# Patient Record
Sex: Female | Born: 1955 | Race: White | Hispanic: No | Smoking: Never smoker
Health system: Southern US, Community
[De-identification: ages and names within clinical notes are randomized; demographics above are authoritative.]

## PROBLEM LIST (undated history)

## (undated) DIAGNOSIS — I7300 Raynaud's syndrome without gangrene: Secondary | ICD-10-CM

## (undated) DIAGNOSIS — K259 Gastric ulcer, unspecified as acute or chronic, without hemorrhage or perforation: Secondary | ICD-10-CM

## (undated) DIAGNOSIS — E079 Disorder of thyroid, unspecified: Secondary | ICD-10-CM

## (undated) DIAGNOSIS — A159 Respiratory tuberculosis unspecified: Secondary | ICD-10-CM

## (undated) DIAGNOSIS — E785 Hyperlipidemia, unspecified: Secondary | ICD-10-CM

## (undated) DIAGNOSIS — C801 Malignant (primary) neoplasm, unspecified: Secondary | ICD-10-CM

## (undated) DIAGNOSIS — M1990 Unspecified osteoarthritis, unspecified site: Secondary | ICD-10-CM

## (undated) DIAGNOSIS — C439 Malignant melanoma of skin, unspecified: Secondary | ICD-10-CM

## (undated) HISTORY — PX: HAMMER TOE SURGERY: SHX385

## (undated) HISTORY — DX: Hyperlipidemia, unspecified: E78.5

## (undated) HISTORY — DX: Raynaud's syndrome without gangrene: I73.00

## (undated) HISTORY — DX: Disorder of thyroid, unspecified: E07.9

## (undated) HISTORY — DX: Malignant (primary) neoplasm, unspecified: C80.1

## (undated) HISTORY — PX: COLONOSCOPY: SHX174

## (undated) HISTORY — DX: Unspecified osteoarthritis, unspecified site: M19.90

## (undated) HISTORY — PX: MELANOMA EXCISION: SHX5266

## (undated) HISTORY — DX: Gastric ulcer, unspecified as acute or chronic, without hemorrhage or perforation: K25.9

## (undated) HISTORY — PX: JOINT REPLACEMENT: SHX530

## (undated) HISTORY — DX: Malignant melanoma of skin, unspecified: C43.9

## (undated) HISTORY — DX: Respiratory tuberculosis unspecified: A15.9

---

## 2009-10-27 ENCOUNTER — Ambulatory Visit: Payer: Self-pay | Admitting: Family Medicine

## 2010-05-01 ENCOUNTER — Ambulatory Visit (HOSPITAL_COMMUNITY): Admission: RE | Admit: 2010-05-01 | Discharge: 2010-05-01 | Payer: Self-pay | Admitting: Orthopedic Surgery

## 2010-11-09 NOTE — Assessment & Plan Note (Signed)
Summary: [facility] MGT CLASS WITH DR Fleur Audino/[facility]   

## 2016-05-03 DIAGNOSIS — L439 Lichen planus, unspecified: Secondary | ICD-10-CM | POA: Diagnosis not present

## 2016-06-14 DIAGNOSIS — L439 Lichen planus, unspecified: Secondary | ICD-10-CM | POA: Diagnosis not present

## 2016-06-14 DIAGNOSIS — L814 Other melanin hyperpigmentation: Secondary | ICD-10-CM | POA: Diagnosis not present

## 2016-06-17 DIAGNOSIS — Z131 Encounter for screening for diabetes mellitus: Secondary | ICD-10-CM | POA: Diagnosis not present

## 2016-06-17 DIAGNOSIS — I10 Essential (primary) hypertension: Secondary | ICD-10-CM | POA: Diagnosis not present

## 2016-06-17 DIAGNOSIS — Z13220 Encounter for screening for lipoid disorders: Secondary | ICD-10-CM | POA: Diagnosis not present

## 2016-06-17 DIAGNOSIS — E039 Hypothyroidism, unspecified: Secondary | ICD-10-CM | POA: Diagnosis not present

## 2016-07-19 DIAGNOSIS — Z23 Encounter for immunization: Secondary | ICD-10-CM | POA: Diagnosis not present

## 2016-07-20 DIAGNOSIS — Z23 Encounter for immunization: Secondary | ICD-10-CM | POA: Diagnosis not present

## 2016-12-27 DIAGNOSIS — Z1211 Encounter for screening for malignant neoplasm of colon: Secondary | ICD-10-CM | POA: Diagnosis not present

## 2016-12-27 DIAGNOSIS — Z1389 Encounter for screening for other disorder: Secondary | ICD-10-CM | POA: Diagnosis not present

## 2016-12-27 DIAGNOSIS — Z1231 Encounter for screening mammogram for malignant neoplasm of breast: Secondary | ICD-10-CM | POA: Diagnosis not present

## 2016-12-27 DIAGNOSIS — E039 Hypothyroidism, unspecified: Secondary | ICD-10-CM | POA: Diagnosis not present

## 2016-12-27 DIAGNOSIS — Z0000 Encounter for general adult medical examination without abnormal findings: Secondary | ICD-10-CM | POA: Diagnosis not present

## 2017-01-16 DIAGNOSIS — Z1231 Encounter for screening mammogram for malignant neoplasm of breast: Secondary | ICD-10-CM | POA: Diagnosis not present

## 2017-01-16 DIAGNOSIS — M8589 Other specified disorders of bone density and structure, multiple sites: Secondary | ICD-10-CM | POA: Diagnosis not present

## 2017-01-16 DIAGNOSIS — M85852 Other specified disorders of bone density and structure, left thigh: Secondary | ICD-10-CM | POA: Diagnosis not present

## 2017-01-17 ENCOUNTER — Encounter: Payer: Self-pay | Admitting: Gastroenterology

## 2017-02-01 ENCOUNTER — Encounter: Payer: Self-pay | Admitting: Sports Medicine

## 2017-02-01 ENCOUNTER — Ambulatory Visit (INDEPENDENT_AMBULATORY_CARE_PROVIDER_SITE_OTHER): Payer: BLUE CROSS/BLUE SHIELD

## 2017-02-01 ENCOUNTER — Ambulatory Visit (INDEPENDENT_AMBULATORY_CARE_PROVIDER_SITE_OTHER): Payer: BLUE CROSS/BLUE SHIELD | Admitting: Sports Medicine

## 2017-02-01 DIAGNOSIS — M79671 Pain in right foot: Secondary | ICD-10-CM

## 2017-02-01 DIAGNOSIS — M722 Plantar fascial fibromatosis: Secondary | ICD-10-CM

## 2017-02-01 DIAGNOSIS — M79672 Pain in left foot: Secondary | ICD-10-CM

## 2017-02-01 MED ORDER — MELOXICAM 15 MG PO TABS: 15.0000 mg | ORAL_TABLET | Freq: Every day | ORAL | 0 refills | Status: DC

## 2017-02-01 MED ORDER — TRIAMCINOLONE ACETONIDE 10 MG/ML IJ SUSP: 10.0000 mg | Freq: Once | INTRAMUSCULAR | Status: DC

## 2017-02-01 MED ORDER — METHYLPREDNISOLONE 4 MG PO TBPK: ORAL_TABLET | ORAL | 0 refills | Status: DC

## 2017-02-01 NOTE — Patient Instructions (Signed)

## 2017-02-01 NOTE — Progress Notes (Signed)
   Subjective:    Patient ID: Beth Soto, female    DOB: 1956-05-27, 61 y.o.   MRN: [id number]  HPI   I have some heel pain on both feet and has been going on since thanksgiving and hurts am and the bottom of both heels hurt and I have tried taping and boots and exercises and I do have raynaud's     Review of Systems  All other systems reviewed and are negative.      Objective:   Physical Exam        Assessment & Plan:

## 2017-02-01 NOTE — Progress Notes (Signed)
Subjective: Beth Soto is a 61 y.o. female patient presents to office with complaint of heel pain on the left and right. Patient admits to post static dyskinesia since thanksgiving. Patient has treated this problem with stretching and change in shoes with no relief. Taping helped a little. Denies any other pedal complaints.   There are no active problems to display for this patient.   No current outpatient prescriptions on file prior to visit.   No current facility-administered medications on file prior to visit.     No Known Allergies  Objective: Physical Exam General: The patient is alert and oriented x3 in no acute distress.  Dermatology: Skin is warm, dry and supple bilateral lower extremities. Nails 1-10 are normal. There is no erythema, edema, no eccymosis, no open lesions present. Integument is otherwise unremarkable.  Vascular: Dorsalis Pedis pulse and Posterior Tibial pulse are 2/4 bilateral. Capillary fill time is immediate to all digits.  Neurological: Grossly intact to light touch with an achilles reflex of +2/5 and a  negative Tinel's sign bilateral.  Musculoskeletal: Tenderness to palpation at the medial calcaneal tubercale and through the insertion of the plantar fascia on the left and right foot. Sometimes pain to top of left foot. No pain with compression of calcaneus bilateral. No pain with tuning fork to calcaneus bilateral. No pain with calf compression bilateral. There is decreased Ankle joint range of motion bilateral. All other joints range of motion within normal limits bilateral. Strength 5/5 in all groups bilateral. 5th hammertoe  Gait: Unassisted, Antalgic avoid weight on left/right heel  Xray, Right and Left foot:  Normal osseous mineralization. Joint spaces preserved. 5th hammertoe No fracture/dislocation/boney destruction. Calcaneal spur present with mild thickening of plantar fascia. No other soft tissue abnormalities or radiopaque foreign bodies.    Assessment and Plan: Problem List Items Addressed This Visit    None    Visit Diagnoses    Plantar fasciitis, bilateral    -  Primary   Relevant Medications   methylPREDNISolone (MEDROL DOSEPAK) 4 MG TBPK tablet   triamcinolone acetonide (KENALOG) 10 MG/ML injection 10 mg (Start on 02/01/2017  1:15 PM)   meloxicam (MOBIC) 15 MG tablet   Other Relevant Orders   DG Foot Complete Right   DG Foot Complete Left   Foot pain, bilateral       Relevant Medications   methylPREDNISolone (MEDROL DOSEPAK) 4 MG TBPK tablet   triamcinolone acetonide (KENALOG) 10 MG/ML injection 10 mg (Start on 02/01/2017  1:15 PM)   meloxicam (MOBIC) 15 MG tablet       -Complete examination performed.  -Xrays reviewed -Discussed with patient in detail the condition of plantar fasciitis, how this occurs and general treatment options. Explained both conservative and surgical treatments.  -After oral consent and aseptic prep, injected a mixture containing 1 ml of 2%  plain lidocaine, 1 ml 0.5% plain marcaine, 0.5 ml of kenalog 10 and 0.5 ml of dexamethasone phosphate into left and right heel. Post-injection care discussed with patient.  -Rx Meloxicam to start after Medrol dose pack is completed -Recommended good supportive shoes and advised use of OTC insert. Explained to patient that if these orthoses work well, we will continue with these. If these do not improve her condition and  pain, we will consider custom molded orthoses. - Explained in detail the use of the fascial brace bilateral which was dispensed at today's visit. -Explained and dispensed to patient daily stretching exercises. -Recommend patient to ice affected area 1-2x daily. -Patient  to return to office in 4 weeks for follow up or sooner if problems or questions arise.  Landis Martins, DPM

## 2017-03-01 ENCOUNTER — Encounter: Payer: Self-pay | Admitting: Sports Medicine

## 2017-03-01 ENCOUNTER — Ambulatory Visit (INDEPENDENT_AMBULATORY_CARE_PROVIDER_SITE_OTHER): Payer: BLUE CROSS/BLUE SHIELD | Admitting: Sports Medicine

## 2017-03-01 DIAGNOSIS — M79672 Pain in left foot: Secondary | ICD-10-CM | POA: Diagnosis not present

## 2017-03-01 DIAGNOSIS — M722 Plantar fascial fibromatosis: Secondary | ICD-10-CM

## 2017-03-01 DIAGNOSIS — M79671 Pain in right foot: Secondary | ICD-10-CM

## 2017-03-01 NOTE — Progress Notes (Signed)
Subjective: Beth Soto is a 61 y.o. female returns to office for follow up evaluation after Left and Right heel injection for plantar fasciitis, injection #1 administered 4 weeks ago. Patient states that the injection seems to help her pain; pain is now 1/10 (95% better) and has  decreased in frequency to the area. Patient denies any recent changes in medications or new problems since last visit.   Admits that she walks to try to lose weight.   There are no active problems to display for this patient.   Current Outpatient Prescriptions on File Prior to Visit  Medication Sig Dispense Refill  . atorvastatin (LIPITOR) 20 MG tablet Take 20 mg by mouth at bedtime.  1  . Cholecalciferol (VITAMIN D3 PO) Take by mouth.    . finasteride (PROSCAR) 5 MG tablet Take 2.5 mg by mouth daily.  12  . folic acid (FOLVITE) 1 MG tablet Take 1 mg by mouth daily.  12  . levothyroxine (SYNTHROID, LEVOTHROID) 137 MCG tablet Take 137 mcg by mouth daily.  3  . MAGNESIUM PO Take by mouth.    . meloxicam (MOBIC) 15 MG tablet Take 1 tablet (15 mg total) by mouth daily. 30 tablet 0  . methylPREDNISolone (MEDROL DOSEPAK) 4 MG TBPK tablet Take as instructed 21 tablet 0   Current Facility-Administered Medications on File Prior to Visit  Medication Dose Route Frequency Provider Last Rate Last Dose  . triamcinolone acetonide (KENALOG) 10 MG/ML injection 10 mg  10 mg Other Once Landis Martins, DPM        No Known Allergies  Objective:   General:  Alert and oriented x 3, in no acute distress  Dermatology: Skin is warm, dry, and supple bilateral. Nails are within normal limits. There is no lower extremity erythema, no eccymosis, no open lesions present bilateral.   Vascular: Dorsalis Pedis and Posterior Tibial pedal pulses are 2/4 bilateral. + hair growth noted bilateral. Capillary Fill Time is 3 seconds in all digits. No varicosities, No edema bilateral lower extremities.   Neurological: Sensation grossly intact  to light touch with an achilles reflex of +2 and a  negative Tinel's sign bilateral. Vibratory, sharp/dull, Semmes Weinstein Monofilament within normal limits.   Musculoskeletal: There is decreased tenderness to palpation at the medial calcaneal tubercale and through the insertion of the plantar fascia on the Left and right foot. No pain with compression to calcaneus or application of tuning fork. There is decreased Ankle joint range of motion bilateral. All other joints range of motion  within normal limits bilateral. Strength 5/5 bilateral.   Assessment and Plan: Problem List Items Addressed This Visit    None    Visit Diagnoses    Plantar fasciitis, bilateral    -  Primary   Foot pain, bilateral          -Complete examination performed.  -Previous x-rays reviewed. -Re-Discussed with patient in detail the condition of plantar fasciitis, how this  occurs related to the foot type of the patient and general treatment options. -Patient to finish Mobic as Rx may call for refill if needed  -No re-injection due to markedly improved symptoms  -Dispensed night splint to alternate between left and right foot  -Continue with stretching, icing, good supportive shoes, inserts daily.  -Discussed long term care and reocurrence; will closely monitor; if fails to improve will consider other treatment modalities.  -Patient to return to office in 4-6 weeks for follow up or sooner if problems or questions arise.  Landis Martins, DPM

## 2017-03-07 ENCOUNTER — Ambulatory Visit (AMBULATORY_SURGERY_CENTER): Payer: Self-pay | Admitting: *Deleted

## 2017-03-07 VITALS — Ht 64.0 in | Wt 190.4 lb

## 2017-03-07 DIAGNOSIS — Z1211 Encounter for screening for malignant neoplasm of colon: Secondary | ICD-10-CM

## 2017-03-07 MED ORDER — NA SULFATE-K SULFATE-MG SULF 17.5-3.13-1.6 GM/177ML PO SOLN: ORAL | 0 refills | Status: DC

## 2017-03-07 NOTE — Progress Notes (Signed)
Pt denies allergies to eggs or soy products. Denies difficulty with sedation or anesthesia. Denies any diet or weight loss medications. Denies use of supplemental oxygen.  Emmi instructions given for procedure.  

## 2017-03-08 ENCOUNTER — Telehealth: Payer: Self-pay | Admitting: *Deleted

## 2017-03-08 MED ORDER — MELOXICAM 15 MG PO TABS: 15.0000 mg | ORAL_TABLET | Freq: Every day | ORAL | 0 refills | Status: DC

## 2017-03-08 NOTE — Telephone Encounter (Signed)
Pt states at last visit Dr. Cannon Kettle said if she needed she could refill the Meloxicam. I informed pt I had seen the refill order in clinicals of 03/01/2017 and would send to her pharmacy.

## 2017-03-09 ENCOUNTER — Encounter: Payer: Self-pay | Admitting: Gastroenterology

## 2017-03-13 ENCOUNTER — Telehealth: Payer: Self-pay | Admitting: Gastroenterology

## 2017-03-13 NOTE — Telephone Encounter (Signed)
Spoke with patient and she stated that she could not afford the suprep.   I will leave her a coupon at the frint desk, and leave her emmi instructions with it.

## 2017-03-20 ENCOUNTER — Ambulatory Visit (AMBULATORY_SURGERY_CENTER): Payer: BLUE CROSS/BLUE SHIELD | Admitting: Gastroenterology

## 2017-03-20 ENCOUNTER — Encounter: Payer: Self-pay | Admitting: Gastroenterology

## 2017-03-20 VITALS — BP 122/72 | HR 58 | Temp 97.3°F | Resp 15 | Ht 64.0 in | Wt 190.0 lb

## 2017-03-20 DIAGNOSIS — D123 Benign neoplasm of transverse colon: Secondary | ICD-10-CM

## 2017-03-20 DIAGNOSIS — K5730 Diverticulosis of large intestine without perforation or abscess without bleeding: Secondary | ICD-10-CM | POA: Diagnosis not present

## 2017-03-20 DIAGNOSIS — D126 Benign neoplasm of colon, unspecified: Secondary | ICD-10-CM

## 2017-03-20 DIAGNOSIS — D122 Benign neoplasm of ascending colon: Secondary | ICD-10-CM

## 2017-03-20 DIAGNOSIS — Z1212 Encounter for screening for malignant neoplasm of rectum: Secondary | ICD-10-CM

## 2017-03-20 DIAGNOSIS — K635 Polyp of colon: Secondary | ICD-10-CM | POA: Diagnosis not present

## 2017-03-20 DIAGNOSIS — Z1211 Encounter for screening for malignant neoplasm of colon: Secondary | ICD-10-CM

## 2017-03-20 MED ORDER — SODIUM CHLORIDE 0.9 % IV SOLN: 500.0000 mL | INTRAVENOUS | Status: AC

## 2017-03-20 NOTE — Patient Instructions (Signed)
YOU HAD AN ENDOSCOPIC PROCEDURE TODAY AT [facility]:   Refer to the procedure report that was given to you for any specific questions about what was found during the examination.  If the procedure report does not answer your questions, please call your gastroenterologist to clarify.  If you requested that your care partner not be given the details of your procedure findings, then the procedure report has been included in a sealed envelope for you to review at your convenience later.  YOU SHOULD EXPECT: Some feelings of bloating in the abdomen. Passage of more gas than usual.  Walking can help get rid of the air that was put into your GI tract during the procedure and reduce the bloating. If you had a lower endoscopy (such as a colonoscopy or flexible sigmoidoscopy) you may notice spotting of blood in your stool or on the toilet paper. If you underwent a bowel prep for your procedure, you may not have a normal bowel movement for a few days.  Please Note:  You might notice some irritation and congestion in your nose or some drainage.  This is from the oxygen used during your procedure.  There is no need for concern and it should clear up in a day or so.  SYMPTOMS TO REPORT IMMEDIATELY:   Following lower endoscopy (colonoscopy or flexible sigmoidoscopy):  Excessive amounts of blood in the stool  Significant tenderness or worsening of abdominal pains  Swelling of the abdomen that is new, acute  Fever of 100F or higher   For urgent or emergent issues, a gastroenterologist can be reached at any hour by calling [phone].  Please read al handouts given to you by your recovery nurse.   DIET:  We do recommend a small meal at first, but then you may proceed to your regular diet.  Drink plenty of fluids but you should avoid alcoholic beverages for 24 hours.  ACTIVITY:  You should plan to take it easy for the rest of today and you should NOT DRIVE or use heavy machinery until  tomorrow (because of the sedation medicines used during the test).    FOLLOW UP: Our staff will call the number listed on your records the next business day following your procedure to check on you and address any questions or concerns that you may have regarding the information given to you following your procedure. If we do not reach you, we will leave a message.  However, if you are feeling well and you are not experiencing any problems, there is no need to return our call.  We will assume that you have returned to your regular daily activities without incident.  If any biopsies were taken you will be contacted by phone or by letter within the next 1-3 weeks.  Please call us at [phone] if you have not heard about the biopsies in 3 weeks.    SIGNATURES/CONFIDENTIALITY: You and/or your care partner have signed paperwork which will be entered into your electronic medical record.  These signatures attest to the fact that that the information above on your After Visit Summary has been reviewed and is understood.  Full responsibility of the confidentiality of this discharge information lies with you and/or your care-partner.  Thank you for letting us take care of your healthcare needs today.

## 2017-03-20 NOTE — Progress Notes (Signed)
Report given to PACU, vss 

## 2017-03-20 NOTE — Op Note (Signed)
[facility] Patient Name: Beth Soto Procedure Date: 03/20/2017 10:32 AM MRN: [id number] Endoscopist: Milus Banister , MD Age: 61 Referring MD:  Date of Birth: 03-28-1956 Gender: Female Account #: [id number] Procedure:                Colonoscopy Indications:              Screening for colorectal malignant neoplasm Medicines:                Monitored Anesthesia Care Procedure:                Pre-Anesthesia Assessment:                           - Prior to the procedure, a History and Physical                            was performed, and patient medications and                            allergies were reviewed. The patient's tolerance of                            previous anesthesia was also reviewed. The risks                            and benefits of the procedure and the sedation                            options and risks were discussed with the patient.                            All questions were answered, and informed consent                            was obtained. Prior Anticoagulants: The patient has                            taken no previous anticoagulant or antiplatelet                            agents. ASA Grade Assessment: II - A patient with                            mild systemic disease. After reviewing the risks                            and benefits, the patient was deemed in                            satisfactory condition to undergo the procedure.                           After obtaining informed consent, the colonoscope  was passed under direct vision. Throughout the                            procedure, the patient's blood pressure, pulse, and                            oxygen saturations were monitored continuously. The                            Colonoscope was introduced through the anus and                            advanced to the the cecum, identified by                            appendiceal orifice and  ileocecal valve. The                            colonoscopy was performed without difficulty. The                            patient tolerated the procedure well. The quality                            of the bowel preparation was excellent. The                            ileocecal valve, appendiceal orifice, and rectum                            were photographed. Scope In: 10:41:12 AM Scope Out: 10:50:39 AM Scope Withdrawal Time: 0 hours 7 minutes 17 seconds  Total Procedure Duration: 0 hours 9 minutes 27 seconds  Findings:                 Two sessile polyps were found in the transverse                            colon and ascending colon. The polyps were 3 to 5                            mm in size. These polyps were removed with a cold                            snare. Resection and retrieval were complete.                           Many small and large-mouthed diverticula were found                            in the left colon.                           The exam was otherwise without abnormality on  direct and retroflexion views. Complications:            No immediate complications. Estimated blood loss:                            None. Estimated Blood Loss:     Estimated blood loss: none. Impression:               - Two 3 to 5 mm polyps in the transverse colon and                            in the ascending colon, removed with a cold snare.                            Resected and retrieved.                           - Diverticulosis in the left colon.                           - The examination was otherwise normal on direct                            and retroflexion views. Recommendation:           - Patient has a contact number available for                            emergencies. The signs and symptoms of potential                            delayed complications were discussed with the                            patient. Return to normal activities  tomorrow.                            Written discharge instructions were provided to the                            patient.                           - Resume previous diet.                           - Continue present medications.                           You will receive a letter within 2-3 weeks with the                            pathology results and my final recommendations.                           If the polyp(s) is proven to be 'pre-cancerous' on  pathology, you will need repeat colonoscopy in 5                            years. If the polyp(s) is NOT 'precancerous' on                            pathology then you should repeat colon cancer                            screening in 10 years with colonoscopy without need                            for colon cancer screening by any method prior to                            then (including stool testing). Milus Banister, MD 03/20/2017 10:53:16 AM This report has been signed electronically.

## 2017-03-20 NOTE — Progress Notes (Signed)
Called to room to assist during endoscopic procedure.  Patient ID and intended procedure confirmed with present staff. Received instructions for my participation in the procedure from the performing physician.  

## 2017-03-21 ENCOUNTER — Telehealth: Payer: Self-pay | Admitting: *Deleted

## 2017-03-21 NOTE — Telephone Encounter (Signed)
No answer, left message to call if questions or concerns. 

## 2017-03-27 ENCOUNTER — Encounter: Payer: Self-pay | Admitting: Gastroenterology

## 2017-04-04 DIAGNOSIS — E785 Hyperlipidemia, unspecified: Secondary | ICD-10-CM | POA: Diagnosis not present

## 2017-04-06 ENCOUNTER — Ambulatory Visit (INDEPENDENT_AMBULATORY_CARE_PROVIDER_SITE_OTHER): Payer: BLUE CROSS/BLUE SHIELD | Admitting: Sports Medicine

## 2017-04-06 DIAGNOSIS — M722 Plantar fascial fibromatosis: Secondary | ICD-10-CM | POA: Diagnosis not present

## 2017-04-06 DIAGNOSIS — M79672 Pain in left foot: Secondary | ICD-10-CM

## 2017-04-06 MED ORDER — TRIAMCINOLONE ACETONIDE 10 MG/ML IJ SUSP: 10.0000 mg | Freq: Once | INTRAMUSCULAR | Status: DC

## 2017-04-06 NOTE — Progress Notes (Signed)
Subjective: Beth Soto is a 61 y.o. female returns to office for follow up evaluation after Left and Right heel injection for plantar fasciitis, injection #1 administered 8 weeks ago. Patient states that the injection seems to help her pain; there is no pain on the right. However, there is a little residual pain on the left, most noticeable after she goes on her exercise walks where she feels a little increase in pain that is resolved, rest and icing. Patient denies any recent changes in medications or new problems since last visit. Reports that she stopped taking her Mobic after colonoscopy.   There are no active problems to display for this patient.   Current Outpatient Prescriptions on File Prior to Visit  Medication Sig Dispense Refill  . Cholecalciferol (VITAMIN D3 PO) Take by mouth.    . finasteride (PROSCAR) 5 MG tablet Take 2.5 mg by mouth daily.  12  . folic acid (FOLVITE) 1 MG tablet Take 1 mg by mouth daily.  12  . levothyroxine (SYNTHROID, LEVOTHROID) 137 MCG tablet Take 137 mcg by mouth daily.  3  . MAGNESIUM PO Take by mouth.    . meloxicam (MOBIC) 15 MG tablet Take 1 tablet (15 mg total) by mouth daily. 30 tablet 0  . methylPREDNISolone (MEDROL DOSEPAK) 4 MG TBPK tablet Take as instructed (Patient not taking: Reported on 03/20/2017) 21 tablet 0   Current Facility-Administered Medications on File Prior to Visit  Medication Dose Route Frequency Provider Last Rate Last Dose  . 0.9 %  sodium chloride infusion  500 mL Intravenous Continuous Milus Banister, MD      . triamcinolone acetonide (KENALOG) 10 MG/ML injection 10 mg  10 mg Other Once Landis Martins, DPM        No Known Allergies  Objective:   General:  Alert and oriented x 3, in no acute distress  Dermatology: Skin is warm, dry, and supple bilateral. Nails are within normal limits. There is no lower extremity erythema, no eccymosis, no open lesions present bilateral.   Vascular: Dorsalis Pedis and Posterior  Tibial pedal pulses are 2/4 bilateral. + hair growth noted bilateral. Capillary Fill Time is 3 seconds in all digits. No varicosities, No edema bilateral lower extremities.   Neurological: Sensation grossly intact to light touch with an achilles reflex of +2 and a  negative Tinel's sign bilateral. Vibratory, sharp/dull, Semmes Weinstein Monofilament within normal limits.   Musculoskeletal: There is decreased tenderness to palpation at the medial calcaneal tubercale and through the insertion of the plantar fascia however, there is some tenderness still on the left. No tenderness on the right. No pain with compression to calcaneus or application of tuning fork. There is decreased Ankle joint range of motion bilateral. All other joints range of motion  within normal limits bilateral. Strength 5/5 bilateral.   Assessment and Plan: Problem List Items Addressed This Visit    None    Visit Diagnoses    Plantar fasciitis of left foot    -  Primary   Relevant Medications   triamcinolone acetonide (KENALOG) 10 MG/ML injection 10 mg   Left foot pain       Relevant Medications   triamcinolone acetonide (KENALOG) 10 MG/ML injection 10 mg      -Complete examination performed.  -Previous x-rays reviewed. -Re-Discussed with patient in detail the condition of plantar fasciitis, how this  occurs related to the foot type of the patient and general treatment options. After oral consent and aseptic prep, injected a  mixture containing 1 ml of 2%  plain lidocaine, 1 ml 0.5% plain marcaine, 0.5 ml of kenalog 10 and 0.5 ml of dexamethasone phosphate into left heel at plantar fascia without complication. Post-injection care discussed with patient.  -Patient has Modic to finish as needed -Continue with night splint to alternate between left and right foot  -Continue with stretching, icing, good supportive shoes, inserts daily.  -Discussed long term care and reocurrence; will closely monitor; if fails to improve  will consider other treatment modalities.  -Patient to return to office as needed or sooner if problems or questions arise. If flares up. Patient may benefit from reinjection and custom orthotics.  Landis Martins, DPM

## 2017-07-06 DIAGNOSIS — D2262 Melanocytic nevi of left upper limb, including shoulder: Secondary | ICD-10-CM | POA: Diagnosis not present

## 2017-07-06 DIAGNOSIS — L821 Other seborrheic keratosis: Secondary | ICD-10-CM | POA: Diagnosis not present

## 2017-07-07 DIAGNOSIS — E039 Hypothyroidism, unspecified: Secondary | ICD-10-CM | POA: Diagnosis not present

## 2017-07-07 DIAGNOSIS — E785 Hyperlipidemia, unspecified: Secondary | ICD-10-CM | POA: Diagnosis not present

## 2017-09-12 DIAGNOSIS — S76011A Strain of muscle, fascia and tendon of right hip, initial encounter: Secondary | ICD-10-CM | POA: Diagnosis not present

## 2017-09-14 DIAGNOSIS — D485 Neoplasm of uncertain behavior of skin: Secondary | ICD-10-CM | POA: Diagnosis not present

## 2017-09-14 DIAGNOSIS — D045 Carcinoma in situ of skin of trunk: Secondary | ICD-10-CM | POA: Diagnosis not present

## 2017-09-28 DIAGNOSIS — D045 Carcinoma in situ of skin of trunk: Secondary | ICD-10-CM | POA: Diagnosis not present

## 2018-01-12 DIAGNOSIS — E039 Hypothyroidism, unspecified: Secondary | ICD-10-CM | POA: Diagnosis not present

## 2018-01-12 DIAGNOSIS — Z1231 Encounter for screening mammogram for malignant neoplasm of breast: Secondary | ICD-10-CM | POA: Diagnosis not present

## 2018-01-12 DIAGNOSIS — E785 Hyperlipidemia, unspecified: Secondary | ICD-10-CM | POA: Diagnosis not present

## 2018-01-12 DIAGNOSIS — J3489 Other specified disorders of nose and nasal sinuses: Secondary | ICD-10-CM | POA: Diagnosis not present

## 2018-02-08 DIAGNOSIS — Z1231 Encounter for screening mammogram for malignant neoplasm of breast: Secondary | ICD-10-CM | POA: Diagnosis not present

## 2018-02-22 DIAGNOSIS — R928 Other abnormal and inconclusive findings on diagnostic imaging of breast: Secondary | ICD-10-CM | POA: Diagnosis not present

## 2018-03-13 DIAGNOSIS — M25551 Pain in right hip: Secondary | ICD-10-CM | POA: Diagnosis not present

## 2018-03-16 DIAGNOSIS — M1611 Unilateral primary osteoarthritis, right hip: Secondary | ICD-10-CM | POA: Diagnosis not present

## 2018-03-16 DIAGNOSIS — M25551 Pain in right hip: Secondary | ICD-10-CM | POA: Diagnosis not present

## 2018-03-19 DIAGNOSIS — M25551 Pain in right hip: Secondary | ICD-10-CM | POA: Diagnosis not present

## 2018-03-21 DIAGNOSIS — M1611 Unilateral primary osteoarthritis, right hip: Secondary | ICD-10-CM | POA: Diagnosis not present

## 2018-03-21 DIAGNOSIS — M25551 Pain in right hip: Secondary | ICD-10-CM | POA: Diagnosis not present

## 2018-04-30 DIAGNOSIS — M25551 Pain in right hip: Secondary | ICD-10-CM | POA: Diagnosis not present

## 2018-06-27 DIAGNOSIS — Z01419 Encounter for gynecological examination (general) (routine) without abnormal findings: Secondary | ICD-10-CM | POA: Diagnosis not present

## 2018-06-27 DIAGNOSIS — Z124 Encounter for screening for malignant neoplasm of cervix: Secondary | ICD-10-CM | POA: Diagnosis not present

## 2018-06-27 DIAGNOSIS — Z1151 Encounter for screening for human papillomavirus (HPV): Secondary | ICD-10-CM | POA: Diagnosis not present

## 2018-06-27 DIAGNOSIS — Z23 Encounter for immunization: Secondary | ICD-10-CM | POA: Diagnosis not present

## 2018-07-09 DIAGNOSIS — L814 Other melanin hyperpigmentation: Secondary | ICD-10-CM | POA: Diagnosis not present

## 2018-07-09 DIAGNOSIS — D1801 Hemangioma of skin and subcutaneous tissue: Secondary | ICD-10-CM | POA: Diagnosis not present

## 2018-07-18 DIAGNOSIS — E785 Hyperlipidemia, unspecified: Secondary | ICD-10-CM | POA: Diagnosis not present

## 2018-07-18 DIAGNOSIS — Z6832 Body mass index (BMI) 32.0-32.9, adult: Secondary | ICD-10-CM | POA: Diagnosis not present

## 2018-07-18 DIAGNOSIS — E039 Hypothyroidism, unspecified: Secondary | ICD-10-CM | POA: Diagnosis not present

## 2018-07-18 DIAGNOSIS — Z1331 Encounter for screening for depression: Secondary | ICD-10-CM | POA: Diagnosis not present

## 2018-07-18 DIAGNOSIS — E669 Obesity, unspecified: Secondary | ICD-10-CM | POA: Diagnosis not present

## 2018-08-16 DIAGNOSIS — E039 Hypothyroidism, unspecified: Secondary | ICD-10-CM | POA: Diagnosis not present

## 2018-12-24 DIAGNOSIS — M1611 Unilateral primary osteoarthritis, right hip: Secondary | ICD-10-CM | POA: Diagnosis not present

## 2018-12-24 DIAGNOSIS — M25551 Pain in right hip: Secondary | ICD-10-CM | POA: Diagnosis not present

## 2018-12-26 DIAGNOSIS — R69 Illness, unspecified: Secondary | ICD-10-CM | POA: Diagnosis not present

## 2019-01-02 DIAGNOSIS — B009 Herpesviral infection, unspecified: Secondary | ICD-10-CM | POA: Diagnosis not present

## 2019-01-02 DIAGNOSIS — L669 Cicatricial alopecia, unspecified: Secondary | ICD-10-CM | POA: Diagnosis not present

## 2019-01-02 DIAGNOSIS — M1611 Unilateral primary osteoarthritis, right hip: Secondary | ICD-10-CM | POA: Diagnosis not present

## 2019-01-02 DIAGNOSIS — E039 Hypothyroidism, unspecified: Secondary | ICD-10-CM | POA: Diagnosis not present

## 2019-01-02 DIAGNOSIS — G8929 Other chronic pain: Secondary | ICD-10-CM | POA: Diagnosis not present

## 2019-01-02 DIAGNOSIS — E785 Hyperlipidemia, unspecified: Secondary | ICD-10-CM | POA: Diagnosis not present

## 2019-01-02 DIAGNOSIS — R030 Elevated blood-pressure reading, without diagnosis of hypertension: Secondary | ICD-10-CM | POA: Diagnosis not present

## 2019-01-02 DIAGNOSIS — N393 Stress incontinence (female) (male): Secondary | ICD-10-CM | POA: Diagnosis not present

## 2019-01-02 DIAGNOSIS — M8580 Other specified disorders of bone density and structure, unspecified site: Secondary | ICD-10-CM | POA: Diagnosis not present

## 2019-01-02 DIAGNOSIS — E669 Obesity, unspecified: Secondary | ICD-10-CM | POA: Diagnosis not present

## 2019-01-23 DIAGNOSIS — M8589 Other specified disorders of bone density and structure, multiple sites: Secondary | ICD-10-CM | POA: Diagnosis not present

## 2019-01-23 DIAGNOSIS — E039 Hypothyroidism, unspecified: Secondary | ICD-10-CM | POA: Diagnosis not present

## 2019-01-23 DIAGNOSIS — E785 Hyperlipidemia, unspecified: Secondary | ICD-10-CM | POA: Diagnosis not present

## 2019-01-23 DIAGNOSIS — Z1231 Encounter for screening mammogram for malignant neoplasm of breast: Secondary | ICD-10-CM | POA: Diagnosis not present

## 2019-03-20 DIAGNOSIS — M1611 Unilateral primary osteoarthritis, right hip: Secondary | ICD-10-CM | POA: Diagnosis not present

## 2019-03-20 DIAGNOSIS — M25551 Pain in right hip: Secondary | ICD-10-CM | POA: Diagnosis not present

## 2019-04-22 DIAGNOSIS — Z1231 Encounter for screening mammogram for malignant neoplasm of breast: Secondary | ICD-10-CM | POA: Diagnosis not present

## 2019-04-22 DIAGNOSIS — M8589 Other specified disorders of bone density and structure, multiple sites: Secondary | ICD-10-CM | POA: Diagnosis not present

## 2019-04-22 DIAGNOSIS — M85852 Other specified disorders of bone density and structure, left thigh: Secondary | ICD-10-CM | POA: Diagnosis not present

## 2019-07-08 DIAGNOSIS — R69 Illness, unspecified: Secondary | ICD-10-CM | POA: Diagnosis not present

## 2019-07-15 DIAGNOSIS — R69 Illness, unspecified: Secondary | ICD-10-CM | POA: Diagnosis not present

## 2019-07-17 DIAGNOSIS — D2239 Melanocytic nevi of other parts of face: Secondary | ICD-10-CM | POA: Diagnosis not present

## 2019-07-17 DIAGNOSIS — L820 Inflamed seborrheic keratosis: Secondary | ICD-10-CM | POA: Diagnosis not present

## 2019-07-17 DIAGNOSIS — D225 Melanocytic nevi of trunk: Secondary | ICD-10-CM | POA: Diagnosis not present

## 2019-07-17 DIAGNOSIS — D1801 Hemangioma of skin and subcutaneous tissue: Secondary | ICD-10-CM | POA: Diagnosis not present

## 2019-07-17 DIAGNOSIS — L821 Other seborrheic keratosis: Secondary | ICD-10-CM | POA: Diagnosis not present

## 2019-07-31 DIAGNOSIS — E039 Hypothyroidism, unspecified: Secondary | ICD-10-CM | POA: Diagnosis not present

## 2019-07-31 DIAGNOSIS — Z1331 Encounter for screening for depression: Secondary | ICD-10-CM | POA: Diagnosis not present

## 2019-07-31 DIAGNOSIS — Z9181 History of falling: Secondary | ICD-10-CM | POA: Diagnosis not present

## 2019-07-31 DIAGNOSIS — E785 Hyperlipidemia, unspecified: Secondary | ICD-10-CM | POA: Diagnosis not present

## 2019-09-04 DIAGNOSIS — R69 Illness, unspecified: Secondary | ICD-10-CM | POA: Diagnosis not present

## 2019-10-29 DIAGNOSIS — Z01419 Encounter for gynecological examination (general) (routine) without abnormal findings: Secondary | ICD-10-CM | POA: Diagnosis not present

## 2019-12-20 DIAGNOSIS — H25813 Combined forms of age-related cataract, bilateral: Secondary | ICD-10-CM | POA: Diagnosis not present

## 2020-01-29 DIAGNOSIS — E039 Hypothyroidism, unspecified: Secondary | ICD-10-CM | POA: Diagnosis not present

## 2020-01-29 DIAGNOSIS — E785 Hyperlipidemia, unspecified: Secondary | ICD-10-CM | POA: Diagnosis not present

## 2020-01-29 DIAGNOSIS — Z139 Encounter for screening, unspecified: Secondary | ICD-10-CM | POA: Diagnosis not present

## 2020-04-02 DIAGNOSIS — M25551 Pain in right hip: Secondary | ICD-10-CM | POA: Insufficient documentation

## 2020-04-02 HISTORY — DX: Pain in right hip: M25.551

## 2020-04-06 DIAGNOSIS — M25551 Pain in right hip: Secondary | ICD-10-CM | POA: Diagnosis not present

## 2020-04-08 DIAGNOSIS — M1611 Unilateral primary osteoarthritis, right hip: Secondary | ICD-10-CM | POA: Diagnosis not present

## 2020-04-09 HISTORY — PX: TOTAL HIP ARTHROPLASTY: SHX124

## 2020-04-10 DIAGNOSIS — M1611 Unilateral primary osteoarthritis, right hip: Secondary | ICD-10-CM | POA: Diagnosis not present

## 2020-04-10 DIAGNOSIS — Z0000 Encounter for general adult medical examination without abnormal findings: Secondary | ICD-10-CM | POA: Diagnosis not present

## 2020-04-10 DIAGNOSIS — E039 Hypothyroidism, unspecified: Secondary | ICD-10-CM | POA: Diagnosis not present

## 2020-04-10 DIAGNOSIS — Z01818 Encounter for other preprocedural examination: Secondary | ICD-10-CM | POA: Diagnosis not present

## 2020-04-27 DIAGNOSIS — Z1231 Encounter for screening mammogram for malignant neoplasm of breast: Secondary | ICD-10-CM | POA: Diagnosis not present

## 2020-05-05 DIAGNOSIS — M1611 Unilateral primary osteoarthritis, right hip: Secondary | ICD-10-CM | POA: Diagnosis not present

## 2020-05-11 DIAGNOSIS — E669 Obesity, unspecified: Secondary | ICD-10-CM | POA: Diagnosis not present

## 2020-05-11 DIAGNOSIS — E039 Hypothyroidism, unspecified: Secondary | ICD-10-CM | POA: Diagnosis not present

## 2020-05-11 DIAGNOSIS — M1990 Unspecified osteoarthritis, unspecified site: Secondary | ICD-10-CM | POA: Diagnosis not present

## 2020-05-11 DIAGNOSIS — Z6834 Body mass index (BMI) 34.0-34.9, adult: Secondary | ICD-10-CM | POA: Diagnosis not present

## 2020-05-11 DIAGNOSIS — G8929 Other chronic pain: Secondary | ICD-10-CM | POA: Diagnosis not present

## 2020-05-11 DIAGNOSIS — Z791 Long term (current) use of non-steroidal anti-inflammatories (NSAID): Secondary | ICD-10-CM | POA: Diagnosis not present

## 2020-05-11 DIAGNOSIS — R030 Elevated blood-pressure reading, without diagnosis of hypertension: Secondary | ICD-10-CM | POA: Diagnosis not present

## 2020-05-11 DIAGNOSIS — R69 Illness, unspecified: Secondary | ICD-10-CM | POA: Diagnosis not present

## 2020-05-11 DIAGNOSIS — E785 Hyperlipidemia, unspecified: Secondary | ICD-10-CM | POA: Diagnosis not present

## 2020-05-11 DIAGNOSIS — B009 Herpesviral infection, unspecified: Secondary | ICD-10-CM | POA: Diagnosis not present

## 2020-06-09 DIAGNOSIS — Z471 Aftercare following joint replacement surgery: Secondary | ICD-10-CM | POA: Diagnosis not present

## 2020-06-09 DIAGNOSIS — Z96641 Presence of right artificial hip joint: Secondary | ICD-10-CM | POA: Diagnosis not present

## 2020-10-01 DIAGNOSIS — L02413 Cutaneous abscess of right upper limb: Secondary | ICD-10-CM

## 2020-10-01 HISTORY — DX: Cutaneous abscess of right upper limb: L02.413

## 2020-10-15 DIAGNOSIS — Z09 Encounter for follow-up examination after completed treatment for conditions other than malignant neoplasm: Secondary | ICD-10-CM | POA: Insufficient documentation

## 2020-10-15 HISTORY — DX: Encounter for follow-up examination after completed treatment for conditions other than malignant neoplasm: Z09

## 2020-10-16 DIAGNOSIS — K580 Irritable bowel syndrome with diarrhea: Secondary | ICD-10-CM | POA: Diagnosis not present

## 2020-10-16 DIAGNOSIS — K529 Noninfective gastroenteritis and colitis, unspecified: Secondary | ICD-10-CM | POA: Diagnosis not present

## 2020-10-16 DIAGNOSIS — K625 Hemorrhage of anus and rectum: Secondary | ICD-10-CM | POA: Diagnosis not present

## 2020-10-16 DIAGNOSIS — R1084 Generalized abdominal pain: Secondary | ICD-10-CM | POA: Diagnosis not present

## 2020-10-19 DIAGNOSIS — K529 Noninfective gastroenteritis and colitis, unspecified: Secondary | ICD-10-CM | POA: Diagnosis not present

## 2020-10-23 DIAGNOSIS — R1084 Generalized abdominal pain: Secondary | ICD-10-CM | POA: Diagnosis not present

## 2020-10-23 DIAGNOSIS — R933 Abnormal findings on diagnostic imaging of other parts of digestive tract: Secondary | ICD-10-CM | POA: Diagnosis not present

## 2020-10-23 DIAGNOSIS — R109 Unspecified abdominal pain: Secondary | ICD-10-CM | POA: Diagnosis not present

## 2020-10-23 DIAGNOSIS — I700 Atherosclerosis of aorta: Secondary | ICD-10-CM | POA: Diagnosis not present

## 2020-10-30 ENCOUNTER — Telehealth: Payer: Self-pay | Admitting: Gastroenterology

## 2020-10-30 NOTE — Telephone Encounter (Signed)
The pt has been scheduled for 3/1 appt with Dr Ardis Hughs to discuss recent CT ordered by PCP.  She has been advised

## 2020-10-30 NOTE — Telephone Encounter (Signed)
° °  I was sent a copy of a CT scan report.  The scan was performed on October 23, 2020.  Indication "mid abdominal pain for 1 week, chronic diarrhea".  Ordering physician was Dr. Therisa Doyne.  The report reads "there appears to be partially circumferential soft tissue thickening of the cecum just superior to the ileocecal valve.  Malignancy is difficult to exclude.  Consider colonoscopy to further evaluate"    Can you please reach out to her.  I believe her primary care physician forwarded this CT scan report to me.  She needs my first available office appointment to discuss and consider further testing.

## 2020-11-02 DIAGNOSIS — Z01419 Encounter for gynecological examination (general) (routine) without abnormal findings: Secondary | ICD-10-CM | POA: Diagnosis not present

## 2020-11-19 DIAGNOSIS — M7542 Impingement syndrome of left shoulder: Secondary | ICD-10-CM | POA: Diagnosis not present

## 2020-12-08 ENCOUNTER — Other Ambulatory Visit: Payer: Self-pay

## 2020-12-08 ENCOUNTER — Ambulatory Visit: Payer: Medicare HMO | Admitting: Gastroenterology

## 2020-12-08 ENCOUNTER — Encounter: Payer: Self-pay | Admitting: Gastroenterology

## 2020-12-08 VITALS — BP 138/70 | HR 76 | Ht 62.5 in | Wt 200.2 lb

## 2020-12-08 DIAGNOSIS — K529 Noninfective gastroenteritis and colitis, unspecified: Secondary | ICD-10-CM

## 2020-12-08 DIAGNOSIS — R933 Abnormal findings on diagnostic imaging of other parts of digestive tract: Secondary | ICD-10-CM | POA: Diagnosis not present

## 2020-12-08 MED ORDER — PLENVU 140 G PO SOLR: 1.0000 | ORAL | 0 refills | Status: DC

## 2020-12-08 NOTE — Progress Notes (Signed)
HPI: This is a very pleasant 65 year old woman whom I last saw the time of a colonoscopy about three and half years ago.   I did a screening colonoscopy for her June 2018.  I removed 2 subcentimeter polyps and documented multiple diverticulum in her left colon.  One of the polyps was adenomatous and the other was hyperplastic.  She needs repeat recall colonoscopy at 7-year interval.  Last month I was sent a copy of the CT scan report, I believe from her primary care physician.  There was no information to put this into context and so I asked that she come in here for an office visit.  The scan was performed on October 23, 2020.  Indication "mid abdominal pain for 1 week, chronic diarrhea".  Ordering physician was Dr. Therisa Doyne.  The report reads "there appears to be partially circumferential soft tissue thickening of the cecum just superior to the ileocecal valve.  Malignancy is difficult to exclude.  Consider colonoscopy to further evaluate"  She tells me today that the CAT scan was part of a work-up for chronic diarrhea.  She was having intermittent loose stools until about 2017.  She was thought probably have IBS-D.  Those improved however in the past two or 3 years they have returned.  Lately she has 3-4 loose watery stools every morning.  She has some urgency after eating at times.  She has seen some minor red blood mixed in with the stool as well at times.  She never has solid stools.  No nausea or vomiting.  Her weight is up 30 pounds in the past 2 years.  She had abdominal pain lower in her belly a single time.  She had stool test and blood work by her primary care physician and she tells me these were all normal specifically C. difficile was negative.  I do not have any those results at time of this visit.  Review of systems: Pertinent positive and negative review of systems were noted in the above HPI section. All other review negative.   Past Medical History:  Diagnosis Date  .  Hyperlipidemia   . Thyroid disease   . Tuberculosis    65 years old; on INS for 1 year    Past Surgical History:  Procedure Laterality Date  . HAMMER TOE SURGERY    . MELANOMA EXCISION    . TOTAL HIP ARTHROPLASTY Right 04/2020    Current Outpatient Medications  Medication Sig Dispense Refill  . acyclovir (ZOVIRAX) 200 MG capsule Take by mouth as needed.    Marland Kitchen atorvastatin (LIPITOR) 20 MG tablet Take 20 mg by mouth at bedtime.    . Calcium Carb-Cholecalciferol (CALCIUM 600 + D PO) Take 1 tablet by mouth daily.    . Cholecalciferol (VITAMIN D3 PO) Take by mouth.    . dicyclomine (BENTYL) 20 MG tablet Take 20 mg by mouth 4 (four) times daily.    Marland Kitchen levothyroxine (SYNTHROID) 150 MCG tablet Take 150 mcg by mouth daily before breakfast.    . melatonin 3 MG TABS tablet Take 1 tablet by mouth daily.     Current Facility-Administered Medications  Medication Dose Route Frequency Provider Last Rate Last Admin  . 0.9 %  sodium chloride infusion  500 mL Intravenous Continuous Milus Banister, MD      . triamcinolone acetonide (KENALOG) 10 MG/ML injection 10 mg  10 mg Other Once Landis Martins, DPM      . triamcinolone acetonide (KENALOG) 10 MG/ML injection 10  mg  10 mg Other Once Landis Martins, DPM        Allergies as of 12/08/2020  . (No Known Allergies)    Family History  Problem Relation Age of Onset  . Colon polyps Mother   . Heart disease Mother   . Heart disease Father   . Diabetes Brother   . Hepatitis C Brother   . Bladder Cancer Brother   . COPD Brother   . Colon cancer Neg Hx     Social History   Socioeconomic History  . Marital status: Married    Spouse name: Not on file  . Number of children: Not on file  . Years of education: Not on file  . Highest education level: Not on file  Occupational History  . Not on file  Tobacco Use  . Smoking status: Never Smoker  . Smokeless tobacco: Never Used  Vaping Use  . Vaping Use: Never used  Substance and Sexual  Activity  . Alcohol use: Yes    Alcohol/week: 7.0 standard drinks    Types: 7 Glasses of wine per week  . Drug use: No  . Sexual activity: Not on file  Other Topics Concern  . Not on file  Social History Narrative  . Not on file   Social Determinants of Health   Financial Resource Strain: Not on file  Food Insecurity: Not on file  Transportation Needs: Not on file  Physical Activity: Not on file  Stress: Not on file  Social Connections: Not on file  Intimate Partner Violence: Not on file     Physical Exam: BP 138/70 (BP Location: Left Arm, Patient Position: Sitting, Cuff Size: Normal)   Pulse 76   Ht 5' 2.5" (1.588 m) Comment: height measured without shoes  Wt 200 lb 4 oz (90.8 kg)   BMI 36.04 kg/m  Constitutional: generally well-appearing Psychiatric: alert and oriented x3 Eyes: extraocular movements intact Mouth: oral pharynx moist, no lesions Neck: supple no lymphadenopathy Cardiovascular: heart regular rate and rhythm Lungs: clear to auscultation bilaterally Abdomen: soft, nontender, nondistended, no obvious ascites, no peritoneal signs, normal bowel sounds Extremities: no lower extremity edema bilaterally Skin: no lesions on visible extremities   Assessment and plan: 65 y.o. female with chronic diarrhea, abnormal colon CT scan  Explained the findings on CT scan are suggestive of possible thickening in her cecum.  This might be malignancy however this would certainly be an unusual presentation for cancer and I reviewed my colonoscopy report from test thousand and 18 and did get a good evaluation of her cecum including the pictures.  Seems more likely this is inflammatory bowel disease or perhaps rather dramatic IBS-D.  Perhaps microscopic colitis and the findings on CT are false positive.  I recommend a colonoscopy at her soonest convenience as work-up.  We will also reach out to her primary care physician to get his blood test and stool test which she tells me were  all normal sent here for review.  In the meantime she will start a single scheduled Imodium every day.  Please see the "Patient Instructions" section for addition details about the plan.   Owens Loffler, MD [facility] 12/08/2020, 3:22 PM  Cc: Nicoletta Dress, MD  Total time on date of encounter was 46  minutes (this included time spent preparing to see the patient reviewing records; obtaining and/or reviewing separately obtained history; performing a medically appropriate exam and/or evaluation; counseling and educating the patient and family if present; ordering medications, tests  or procedures if applicable; and documenting clinical information in the health record).

## 2020-12-08 NOTE — Patient Instructions (Signed)
If you are age 67 or older, your body mass index should be between 23-30. Your Body mass index is 36.04 kg/m. If this is out of the aforementioned range listed, please consider follow up with your Primary Care Provider.  You have been scheduled for a colonoscopy. Please follow written instructions given to you at your visit today.  Please pick up your prep supplies at the pharmacy within the next 1-3 days. If you use inhalers (even only as needed), please bring them with you on the day of your procedure.  Due to recent changes in healthcare laws, you may see the results of your imaging and laboratory studies on MyChart before your provider has had a chance to review them.  We understand that in some cases there may be results that are confusing or concerning to you. Not all laboratory results come back in the same time frame and the provider may be waiting for multiple results in order to interpret others.  Please give [place] 48 hours in order for your provider to thoroughly review all the results before contacting the office for clarification of your results.   Please purchase the following medications over the counter and take as directed:  START: imodium one pill once daily.  We will obtain recent lab work from Dr [employer] office.  Thank you for entrusting me with your care and choosing [facility].  Dr Ardis Hughs

## 2020-12-11 ENCOUNTER — Ambulatory Visit (AMBULATORY_SURGERY_CENTER): Payer: Medicare HMO | Admitting: Gastroenterology

## 2020-12-11 ENCOUNTER — Encounter: Payer: Self-pay | Admitting: Gastroenterology

## 2020-12-11 ENCOUNTER — Other Ambulatory Visit: Payer: Self-pay

## 2020-12-11 ENCOUNTER — Telehealth: Payer: Self-pay | Admitting: Gastroenterology

## 2020-12-11 VITALS — BP 125/72 | HR 66 | Temp 95.7°F | Resp 12 | Ht 62.5 in | Wt 200.0 lb

## 2020-12-11 DIAGNOSIS — K648 Other hemorrhoids: Secondary | ICD-10-CM

## 2020-12-11 DIAGNOSIS — K5730 Diverticulosis of large intestine without perforation or abscess without bleeding: Secondary | ICD-10-CM

## 2020-12-11 DIAGNOSIS — K529 Noninfective gastroenteritis and colitis, unspecified: Secondary | ICD-10-CM | POA: Diagnosis not present

## 2020-12-11 DIAGNOSIS — Z0389 Encounter for observation for other suspected diseases and conditions ruled out: Secondary | ICD-10-CM | POA: Diagnosis not present

## 2020-12-11 DIAGNOSIS — R197 Diarrhea, unspecified: Secondary | ICD-10-CM | POA: Diagnosis not present

## 2020-12-11 DIAGNOSIS — R933 Abnormal findings on diagnostic imaging of other parts of digestive tract: Secondary | ICD-10-CM | POA: Diagnosis not present

## 2020-12-11 MED ORDER — SODIUM CHLORIDE 0.9 % IV SOLN: 500.0000 mL | Freq: Once | INTRAVENOUS | Status: DC

## 2020-12-11 NOTE — Progress Notes (Signed)
Called to room to assist during endoscopic procedure.  Patient ID and intended procedure confirmed with present staff. Received instructions for my participation in the procedure from the performing physician.  

## 2020-12-11 NOTE — Telephone Encounter (Signed)
Reviewed outside records.  Stool testing January 2022, C. difficile negative.  CBC normal except for white blood cell count 13,000.  TSH normal.

## 2020-12-11 NOTE — Op Note (Signed)
[facility] Patient Name: Beth Soto Procedure Date: 12/11/2020 1:48 PM MRN: [id number] Endoscopist: Milus Banister , MD Age: 65 Referring MD:  Date of Birth: [birth year] Gender: Female Account #: [id number] Procedure:                Colonoscopy Indications:              Chronic diarrhea, abnormal cecum on recent CT Medicines:                Monitored Anesthesia Care Procedure:                Pre-Anesthesia Assessment:                           - Prior to the procedure, a History and Physical                            was performed, and patient medications and                            allergies were reviewed. The patient's tolerance of                            previous anesthesia was also reviewed. The risks                            and benefits of the procedure and the sedation                            options and risks were discussed with the patient.                            All questions were answered, and informed consent                            was obtained. Prior Anticoagulants: The patient has                            taken no previous anticoagulant or antiplatelet                            agents. ASA Grade Assessment: II - A patient with                            mild systemic disease. After reviewing the risks                            and benefits, the patient was deemed in                            satisfactory condition to undergo the procedure.                           After obtaining informed consent, the colonoscope  was passed under direct vision. Throughout the                            procedure, the patient's blood pressure, pulse, and                            oxygen saturations were monitored continuously. The                            Olympus [id number]) [id number] was introduced                            through the anus and advanced to the the terminal                            ileum. The  colonoscopy was performed without                            difficulty. The patient tolerated the procedure                            well. The quality of the bowel preparation was                            good. The terminal ileum, ileocecal valve,                            appendiceal orifice, and rectum were photographed. Scope In: 1:58:03 PM Scope Out: 2:09:06 PM Scope Withdrawal Time: 0 hours 8 minutes 20 seconds  Total Procedure Duration: 0 hours 11 minutes 3 seconds  Findings:                 The terminal ileum appeared normal.                           Biopsies for histology were taken with a cold                            forceps from the entire colon for evaluation of                            microscopic colitis.                           Left colon diverticulosis, mild.                           Internal hemorrhoids were found during                            retroflexion. The hemorrhoids were small.                           The exam was otherwise without abnormality on  direct and retroflexion views. Complications:            No immediate complications. Estimated blood loss:                            None. Estimated Blood Loss:     Estimated blood loss: none. Impression:               - The examined portion of the ileum was normal.                           - Internal hemorrhoids.                           - Diverticulosis.                           - The examination was otherwise normal on direct                            and retroflexion views. The abnormal area described                            on recent CT was a FALSE POSITIVE.                           - Biopsies were taken with a cold forceps from the                            entire colon for evaluation of microscopic colitis. Recommendation:           - Patient has a contact number available for                            emergencies. The signs and symptoms of potential                             delayed complications were discussed with the                            patient. Return to normal activities tomorrow.                            Written discharge instructions were provided to the                            patient.                           - Resume previous diet.                           - Continue present medications.                           - Await pathology results. For now please start  taking a single OTC imodium every morning shortly                            after waking on a scheduled basis. Milus Banister, MD 12/11/2020 2:14:23 PM This report has been signed electronically.

## 2020-12-11 NOTE — Progress Notes (Signed)
pt tolerated well. VSS. awake and to recovery. Report given to RN.  

## 2020-12-11 NOTE — Progress Notes (Signed)
[facility] 

## 2020-12-11 NOTE — Patient Instructions (Signed)
YOU HAD AN ENDOSCOPIC PROCEDURE TODAY AT THE [facility]:   Refer to the procedure report that was given to you for any specific questions about what was found during the examination.  If the procedure report does not answer your questions, please call your gastroenterologist to clarify.  If you requested that your care partner not be given the details of your procedure findings, then the procedure report has been included in a sealed envelope for you to review at your convenience later.  YOU SHOULD EXPECT: Some feelings of bloating in the abdomen. Passage of more gas than usual.  Walking can help get rid of the air that was put into your GI tract during the procedure and reduce the bloating. If you had a lower endoscopy (such as a colonoscopy or flexible sigmoidoscopy) you may notice spotting of blood in your stool or on the toilet paper. If you underwent a bowel prep for your procedure, you may not have a normal bowel movement for a few days.  Please Note:  You might notice some irritation and congestion in your nose or some drainage.  This is from the oxygen used during your procedure.  There is no need for concern and it should clear up in a day or so.  SYMPTOMS TO REPORT IMMEDIATELY:   Following lower endoscopy (colonoscopy or flexible sigmoidoscopy):  Excessive amounts of blood in the stool  Significant tenderness or worsening of abdominal pains  Swelling of the abdomen that is new, acute  Fever of 100F or higher  For urgent or emergent issues, a gastroenterologist can be reached at any hour by calling [phone]. Do not use MyChart messaging for urgent concerns.    DIET:  We do recommend a small meal at first, but then you may proceed to your regular diet.  Drink plenty of fluids but you should avoid alcoholic beverages for 24 hours.  ACTIVITY:  You should plan to take it easy for the rest of today and you should NOT DRIVE or use heavy machinery until tomorrow (because  of the sedation medicines used during the test).    FOLLOW UP: Our staff will call the number listed on your records 48-72 hours following your procedure to check on you and address any questions or concerns that you may have regarding the information given to you following your procedure. If we do not reach you, we will leave a message.  We will attempt to reach you two times.  During this call, we will ask if you have developed any symptoms of COVID 19. If you develop any symptoms (ie: fever, flu-like symptoms, shortness of breath, cough etc.) before then, please call [phone].  If you test positive for Covid 19 in the 2 weeks post procedure, please call and report this information to us.    If any biopsies were taken you will be contacted by phone or by letter within the next 1-3 weeks.  Please call us at [phone] if you have not heard about the biopsies in 3 weeks.    SIGNATURES/CONFIDENTIALITY: You and/or your care partner have signed paperwork which will be entered into your electronic medical record.  These signatures attest to the fact that that the information above on your After Visit Summary has been reviewed and is understood.  Full responsibility of the confidentiality of this discharge information lies with you and/or your care-partner. 

## 2020-12-15 ENCOUNTER — Telehealth: Payer: Self-pay | Admitting: *Deleted

## 2020-12-15 NOTE — Telephone Encounter (Signed)
  Follow up Call-  Call back number 12/11/2020  Post procedure Call Back phone  # [phone]  Permission to leave phone message Yes  Some recent data might be hidden     Patient questions:  Do you have a fever, pain , or abdominal swelling? No. Pain Score  0 *  Have you tolerated food without any problems? Yes.    Have you been able to return to your normal activities? Yes.    Do you have any questions about your discharge instructions: Diet   No. Medications  No. Follow up visit  No.  Do you have questions or concerns about your Care? No.  Actions: * If pain score is 4 or above: No action needed, pain <4

## 2020-12-22 DIAGNOSIS — Z9181 History of falling: Secondary | ICD-10-CM | POA: Diagnosis not present

## 2020-12-22 DIAGNOSIS — Z1331 Encounter for screening for depression: Secondary | ICD-10-CM | POA: Diagnosis not present

## 2020-12-22 DIAGNOSIS — E669 Obesity, unspecified: Secondary | ICD-10-CM | POA: Diagnosis not present

## 2020-12-22 DIAGNOSIS — E785 Hyperlipidemia, unspecified: Secondary | ICD-10-CM | POA: Diagnosis not present

## 2020-12-22 DIAGNOSIS — Z0000 Encounter for general adult medical examination without abnormal findings: Secondary | ICD-10-CM | POA: Diagnosis not present

## 2021-01-28 DIAGNOSIS — E039 Hypothyroidism, unspecified: Secondary | ICD-10-CM | POA: Diagnosis not present

## 2021-01-28 DIAGNOSIS — Z6834 Body mass index (BMI) 34.0-34.9, adult: Secondary | ICD-10-CM | POA: Diagnosis not present

## 2021-01-28 DIAGNOSIS — E785 Hyperlipidemia, unspecified: Secondary | ICD-10-CM | POA: Diagnosis not present

## 2021-03-10 ENCOUNTER — Other Ambulatory Visit: Payer: Medicare HMO

## 2021-03-10 ENCOUNTER — Encounter: Payer: Self-pay | Admitting: Gastroenterology

## 2021-03-10 ENCOUNTER — Ambulatory Visit: Payer: Medicare HMO | Admitting: Gastroenterology

## 2021-03-10 VITALS — Ht 64.0 in | Wt 200.0 lb

## 2021-03-10 DIAGNOSIS — R195 Other fecal abnormalities: Secondary | ICD-10-CM | POA: Diagnosis not present

## 2021-03-10 NOTE — Addendum Note (Signed)
Addended by: Isaiah Serge D on: 03/10/2021 02:26 PM   Modules accepted: Orders

## 2021-03-10 NOTE — Progress Notes (Signed)
Review of pertinent gastrointestinal problems: 1.  History of adenomatous colon polyps: Colonoscopy June 2018 2 subcentimeter polyps were removed, left-sided diverticulosis was noted.  Pathology.  One of the polyps was adenomatous.  Recommended recall at 7-year interval. 2.  Loose stools and abnormal colon on CT.  January 2022, indication mid abdominal pain for 1 week, chronic diarrhea".  The report read "there appears to be a potentially circumferential soft tissue thickening of the cecum just superior to the ileocecal valve.  Malignancy is difficult to exclude.  Consider colonoscopy to further evaluate".  Repeat colonoscopy March 2022 normal terminal ileum, diverticulosis was noted, internal hemorrhoids were noted.  The examination was otherwise completely normal biopsies were taken from throughout the colon to check for microscopic colitis.  Pathology.  No microscopic colitis.  She was recommended to take a single scheduled Imodium on a daily basis.  HPI: This is a very pleasant 65 year old woman  I last saw her at the time of colonoscopy.  See that results summarized above.  Biopsies were all negative for microscopic colitis and I recommended she take a single Imodium on a once daily basis.  She is having quite a bit less frequency.  She used to have 4-5 loose stools every morning and now she is only having 1-2 loose stools every morning.  It is however still loose.  She tells me she has not had a formed stool in at least 5 years.  She is not sure if she has ever been tested for celiac sprue or pancreatic exocrine insufficiency.  She never sees blood in her stool.  Her weight is overall quite stable   ROS: complete GI ROS as described in HPI, all other review negative.  Constitutional:  No unintentional weight loss   Past Medical History:  Diagnosis Date  . Arthritis   . Cancer ([place])    melanoma  . Hyperlipidemia   . Thyroid disease   . Tuberculosis    65 years old; on INS for 1 year     Past Surgical History:  Procedure Laterality Date  . COLONOSCOPY    . HAMMER TOE SURGERY    . MELANOMA EXCISION    . TOTAL HIP ARTHROPLASTY Right 04/2020    Current Outpatient Medications  Medication Sig Dispense Refill  . acyclovir (ZOVIRAX) 400 MG tablet Take by mouth.    Marland Kitchen atorvastatin (LIPITOR) 20 MG tablet Take 20 mg by mouth at bedtime.    . Calcium Carb-Cholecalciferol (CALCIUM 600 + D PO) Take 1 tablet by mouth daily.    . Cholecalciferol (VITAMIN D3 PO) Take by mouth.    . levothyroxine (SYNTHROID) 150 MCG tablet Take 150 mcg by mouth daily before breakfast.    . Melatonin 10 MG CAPS Take 1 capsule by mouth at bedtime. They are gummies     Current Facility-Administered Medications  Medication Dose Route Frequency Provider Last Rate Last Admin  . 0.9 %  sodium chloride infusion  500 mL Intravenous Continuous Milus Banister, MD        Allergies as of 03/10/2021  . (No Known Allergies)    Family History  Problem Relation Age of Onset  . Colon polyps Mother   . Heart disease Mother   . Heart disease Father   . Diabetes Brother   . Hepatitis C Brother   . Bladder Cancer Brother   . COPD Brother   . Colon cancer Neg Hx   . Esophageal cancer Neg Hx   . Rectal cancer Neg  Hx   . Stomach cancer Neg Hx     Social History   Socioeconomic History  . Marital status: Widowed    Spouse name: Not on file  . Number of children: 1  . Years of education: Not on file  . Highest education level: Not on file  Occupational History  . Occupation: retired [employer] fundraiser  Tobacco Use  . Smoking status: Never Smoker  . Smokeless tobacco: Never Used  Vaping Use  . Vaping Use: Never used  Substance and Sexual Activity  . Alcohol use: Yes    Comment: occassioally  . Drug use: No  . Sexual activity: Not on file  Other Topics Concern  . Not on file  Social History Narrative  . Not on file   Social Determinants of Health   Financial Resource Strain: Not on file   Food Insecurity: Not on file  Transportation Needs: Not on file  Physical Activity: Not on file  Stress: Not on file  Social Connections: Not on file  Intimate Partner Violence: Not on file     Physical Exam: Ht 5\' 4"  (1.626 m)   Wt 200 lb (90.7 kg)   BMI 34.33 kg/m  Constitutional: generally well-appearing Psychiatric: alert and oriented x3 Abdomen: soft, nontender, nondistended, no obvious ascites, no peritoneal signs, normal bowel sounds No peripheral edema noted in lower extremities  Assessment and plan: 65 y.o. female with chronic loose stools  Her frequency is much improved since she started taking a single Imodium on an every morning basis.  We discussed celiac sprue and pancreatic exocrine insufficiencies as potential causes for why her stool has been loose for so long.  I recommended testing for both of these with serology and pancreatic fecal elastase.  Please see the "Patient Instructions" section for addition details about the plan.  Owens Loffler, MD [facility] 03/10/2021, 2:08 PM   Total time on date of encounter was 25 minutes (this included time spent preparing to see the patient reviewing records; obtaining and/or reviewing separately obtained history; performing a medically appropriate exam and/or evaluation; counseling and educating the patient and family if present; ordering medications, tests or procedures if applicable; and documenting clinical information in the health record).

## 2021-03-10 NOTE — Patient Instructions (Addendum)
If you are age 67 or older, your body mass index should be between 23-30. Your Body mass index is 34.33 kg/m. If this is out of the aforementioned range listed, please consider follow up with your Primary Care Provider.  __________________________________________________________  The [facility] providers would like to encourage you to use [facility] to communicate with providers for non-urgent requests or questions.  Due to long hold times on the telephone, sending your provider a message by [facility] may be a faster and more efficient way to get a response.  Please allow 48 business hours for a response.  Please remember that this is for non-urgent requests.   Your provider has requested that you go to the basement level for lab work before leaving today. Press "B" on the elevator. The lab is located at the first door on the left as you exit the elevator.  Due to recent changes in healthcare laws, you may see the results of your imaging and laboratory studies on MyChart before your provider has had a chance to review them.  We understand that in some cases there may be results that are confusing or concerning to you. Not all laboratory results come back in the same time frame and the provider may be waiting for multiple results in order to interpret others.  Please give [place] 48 hours in order for your provider to thoroughly review all the results before contacting the office for clarification of your results.     I appreciate the opportunity to care for you. Owens Loffler, MD

## 2021-03-11 LAB — TISSUE TRANSGLUTAMINASE, IGA: (tTG) Ab, IgA: <1 U/mL

## 2021-03-11 LAB — IGA: Immunoglobulin A: 158 mg/dL (ref 70–320)

## 2021-03-12 ENCOUNTER — Other Ambulatory Visit: Payer: Medicare HMO

## 2021-03-12 DIAGNOSIS — R195 Other fecal abnormalities: Secondary | ICD-10-CM

## 2021-03-18 LAB — PANCREATIC ELASTASE, FECAL: Pancreatic Elastase-1, Stool: >500 mcg/g

## 2021-04-28 DIAGNOSIS — N959 Unspecified menopausal and perimenopausal disorder: Secondary | ICD-10-CM | POA: Diagnosis not present

## 2021-04-28 DIAGNOSIS — M85852 Other specified disorders of bone density and structure, left thigh: Secondary | ICD-10-CM | POA: Diagnosis not present

## 2021-04-28 DIAGNOSIS — Z1231 Encounter for screening mammogram for malignant neoplasm of breast: Secondary | ICD-10-CM | POA: Diagnosis not present

## 2021-05-06 DIAGNOSIS — Z96641 Presence of right artificial hip joint: Secondary | ICD-10-CM | POA: Diagnosis not present

## 2021-06-17 DIAGNOSIS — H524 Presbyopia: Secondary | ICD-10-CM | POA: Diagnosis not present

## 2021-07-07 ENCOUNTER — Encounter: Payer: Self-pay | Admitting: Gastroenterology

## 2021-07-07 ENCOUNTER — Ambulatory Visit: Payer: Medicare HMO | Admitting: Gastroenterology

## 2021-07-07 VITALS — BP 128/82 | HR 70 | Ht 64.0 in | Wt 200.0 lb

## 2021-07-07 DIAGNOSIS — K529 Noninfective gastroenteritis and colitis, unspecified: Secondary | ICD-10-CM | POA: Diagnosis not present

## 2021-07-07 NOTE — Patient Instructions (Signed)
If you are age 67 or older, your body mass index should be between 23-30. Your Body mass index is 34.33 kg/m. If this is out of the aforementioned range listed, please consider follow up with your Primary Care Provider. _________________________________________________________  The [facility] providers would like to encourage you to use [facility] to communicate with providers for non-urgent requests or questions.  Due to long hold times on the telephone, sending your provider a message by [facility] may be a faster and more efficient way to get a response.  Please allow 48 business hours for a response.  Please remember that this is for non-urgent requests.   CONTINUE: Imodium daily.  You will follow up with our office on an as needed basis.  Thank you for entrusting me with your care and choosing [facility].  Dr Ardis Hughs

## 2021-07-07 NOTE — Progress Notes (Signed)
Review of pertinent gastrointestinal problems: 1.  History of adenomatous colon polyps: Colonoscopy June 2018 2 subcentimeter polyps were removed, left-sided diverticulosis was noted.  Pathology.  One of the polyps was adenomatous.  Recommended recall at 7-year interval. 2.  Loose stools and abnormal colon on CT.  January 2022, indication mid abdominal pain for 1 week, chronic diarrhea".  The report read "there appears to be a potentially circumferential soft tissue thickening of the cecum just superior to the ileocecal valve.  Malignancy is difficult to exclude.  Consider colonoscopy to further evaluate".  Repeat colonoscopy March 2022 normal terminal ileum, diverticulosis was noted, internal hemorrhoids were noted.  The examination was otherwise completely normal biopsies were taken from throughout the colon to check for microscopic colitis.  Pathology.  No microscopic colitis.  She was recommended to take a single scheduled Imodium on a daily basis.  03/2021 lab testing showed normal pancreatic elastase, normal celiac sprue serologies.   HPI: This is a very pleasant 65 year old woman whom I last saw 3 months ago.  In the office  Her weight is exactly the same today as it was 3 months ago.  She has been taking a single scheduled over-the-counter Imodium every morning shortly after she wakes up and says that her bowel habits are "much better".  She has 1-2 loose stools daily.  This is much better.  Her urgency is completely gone.  She has had some minor streaking of red blood on stools on occasion.   ROS: complete GI ROS as described in HPI, all other review negative.  Constitutional:  No unintentional weight loss   Past Medical History:  Diagnosis Date   Arthritis    Cancer ([place])    melanoma   Hyperlipidemia    Thyroid disease    Tuberculosis    65 years old; on INS for 1 year    Past Surgical History:  Procedure Laterality Date   COLONOSCOPY     HAMMER TOE SURGERY     MELANOMA  EXCISION     TOTAL HIP ARTHROPLASTY Right 04/2020    Current Outpatient Medications  Medication Sig Dispense Refill   acyclovir (ZOVIRAX) 400 MG tablet Take by mouth.     atorvastatin (LIPITOR) 20 MG tablet Take 20 mg by mouth at bedtime.     Calcium Carb-Cholecalciferol (CALCIUM 600 + D PO) Take 1 tablet by mouth daily.     Cholecalciferol (VITAMIN D3 PO) Take by mouth.     levothyroxine (SYNTHROID) 150 MCG tablet Take 150 mcg by mouth daily before breakfast.     Melatonin 10 MG CAPS Take 1 capsule by mouth at bedtime. They are gummies     Current Facility-Administered Medications  Medication Dose Route Frequency Provider Last Rate Last Admin   0.9 %  sodium chloride infusion  500 mL Intravenous Continuous Milus Banister, MD        Allergies as of 07/07/2021   (No Known Allergies)    Family History  Problem Relation Age of Onset   Colon polyps Mother    Heart disease Mother    Heart disease Father    Diabetes Brother    Hepatitis C Brother    Bladder Cancer Brother    COPD Brother    Colon cancer Neg Hx    Esophageal cancer Neg Hx    Rectal cancer Neg Hx    Stomach cancer Neg Hx     Social History   Socioeconomic History   Marital status: Widowed  Spouse name: Not on file   Number of children: 1   Years of education: Not on file   Highest education level: Not on file  Occupational History   Occupation: retired Charity fundraiser  Tobacco Use   Smoking status: Never   Smokeless tobacco: Never  Vaping Use   Vaping Use: Never used  Substance and Sexual Activity   Alcohol use: Yes    Comment: occassioally   Drug use: No   Sexual activity: Not on file  Other Topics Concern   Not on file  Social History Narrative   Not on file   Social Determinants of Health   Financial Resource Strain: Not on file  Food Insecurity: Not on file  Transportation Needs: Not on file  Physical Activity: Not on file  Stress: Not on file  Social Connections: Not on file   Intimate Partner Violence: Not on file     Physical Exam: BP 128/82   Pulse 70   Ht 5\' 4"  (1.626 m)   Wt 200 lb (90.7 kg)   BMI 34.33 kg/m  Constitutional: generally well-appearing Psychiatric: alert and oriented x3 Abdomen: soft, nontender, nondistended, no obvious ascites, no peritoneal signs, normal bowel sounds No peripheral edema noted in lower extremities  Assessment and plan: 65 y.o. female with chronic loose stools  Blood work, stool testing, colonoscopy all this year have shown no clear etiology.  She is quite happy with scheduled single Imodium every morning and will continue that.  She knows that she can increase to 2 pills once daily for 1-1/2 pills once daily if needed.  She has noticed some very minor blood on stool lately.  This is very likely from the internal hemorrhoids which I noted.  Colonoscopy 6 months ago.  Recommended over-the-counter suppositories or Preparation H as needed.  She will return to see me in follow-up on an as-needed basis.  Please see the "Patient Instructions" section for addition details about the plan.  Owens Loffler, MD [facility] 07/07/2021, 9:46 AM   Total time on date of encounter was 30 minutes (this included time spent preparing to see the patient reviewing records; obtaining and/or reviewing separately obtained history; performing a medically appropriate exam and/or evaluation; counseling and educating the patient and family if present; ordering medications, tests or procedures if applicable; and documenting clinical information in the health record).

## 2021-07-21 DIAGNOSIS — L821 Other seborrheic keratosis: Secondary | ICD-10-CM | POA: Diagnosis not present

## 2021-07-21 DIAGNOSIS — D225 Melanocytic nevi of trunk: Secondary | ICD-10-CM | POA: Diagnosis not present

## 2021-07-21 DIAGNOSIS — L814 Other melanin hyperpigmentation: Secondary | ICD-10-CM | POA: Diagnosis not present

## 2021-07-21 DIAGNOSIS — D2239 Melanocytic nevi of other parts of face: Secondary | ICD-10-CM | POA: Diagnosis not present

## 2021-08-04 DIAGNOSIS — E785 Hyperlipidemia, unspecified: Secondary | ICD-10-CM | POA: Diagnosis not present

## 2021-08-04 DIAGNOSIS — E039 Hypothyroidism, unspecified: Secondary | ICD-10-CM | POA: Diagnosis not present

## 2021-08-04 DIAGNOSIS — Z23 Encounter for immunization: Secondary | ICD-10-CM | POA: Diagnosis not present

## 2021-08-04 DIAGNOSIS — R7301 Impaired fasting glucose: Secondary | ICD-10-CM | POA: Diagnosis not present

## 2021-08-04 DIAGNOSIS — J208 Acute bronchitis due to other specified organisms: Secondary | ICD-10-CM | POA: Diagnosis not present

## 2021-08-11 DIAGNOSIS — E785 Hyperlipidemia, unspecified: Secondary | ICD-10-CM | POA: Diagnosis not present

## 2021-08-11 DIAGNOSIS — R030 Elevated blood-pressure reading, without diagnosis of hypertension: Secondary | ICD-10-CM | POA: Diagnosis not present

## 2021-08-11 DIAGNOSIS — Z85820 Personal history of malignant melanoma of skin: Secondary | ICD-10-CM | POA: Diagnosis not present

## 2021-08-11 DIAGNOSIS — E669 Obesity, unspecified: Secondary | ICD-10-CM | POA: Diagnosis not present

## 2021-08-11 DIAGNOSIS — K589 Irritable bowel syndrome without diarrhea: Secondary | ICD-10-CM | POA: Diagnosis not present

## 2021-08-11 DIAGNOSIS — M8580 Other specified disorders of bone density and structure, unspecified site: Secondary | ICD-10-CM | POA: Diagnosis not present

## 2021-08-11 DIAGNOSIS — I700 Atherosclerosis of aorta: Secondary | ICD-10-CM | POA: Diagnosis not present

## 2021-08-11 DIAGNOSIS — Z96649 Presence of unspecified artificial hip joint: Secondary | ICD-10-CM | POA: Diagnosis not present

## 2021-08-11 DIAGNOSIS — Z85828 Personal history of other malignant neoplasm of skin: Secondary | ICD-10-CM | POA: Diagnosis not present

## 2021-08-11 DIAGNOSIS — M1990 Unspecified osteoarthritis, unspecified site: Secondary | ICD-10-CM | POA: Diagnosis not present

## 2021-08-11 DIAGNOSIS — E039 Hypothyroidism, unspecified: Secondary | ICD-10-CM | POA: Diagnosis not present

## 2021-08-11 DIAGNOSIS — Z6833 Body mass index (BMI) 33.0-33.9, adult: Secondary | ICD-10-CM | POA: Diagnosis not present

## 2021-08-17 DIAGNOSIS — I480 Paroxysmal atrial fibrillation: Secondary | ICD-10-CM | POA: Diagnosis not present

## 2021-08-19 DIAGNOSIS — A159 Respiratory tuberculosis unspecified: Secondary | ICD-10-CM | POA: Insufficient documentation

## 2021-08-19 DIAGNOSIS — I7300 Raynaud's syndrome without gangrene: Secondary | ICD-10-CM | POA: Insufficient documentation

## 2021-08-19 DIAGNOSIS — E079 Disorder of thyroid, unspecified: Secondary | ICD-10-CM | POA: Insufficient documentation

## 2021-08-19 DIAGNOSIS — E785 Hyperlipidemia, unspecified: Secondary | ICD-10-CM | POA: Insufficient documentation

## 2021-08-19 DIAGNOSIS — M5412 Radiculopathy, cervical region: Secondary | ICD-10-CM

## 2021-08-19 DIAGNOSIS — K259 Gastric ulcer, unspecified as acute or chronic, without hemorrhage or perforation: Secondary | ICD-10-CM | POA: Insufficient documentation

## 2021-08-19 DIAGNOSIS — M1990 Unspecified osteoarthritis, unspecified site: Secondary | ICD-10-CM | POA: Insufficient documentation

## 2021-08-19 DIAGNOSIS — M25519 Pain in unspecified shoulder: Secondary | ICD-10-CM | POA: Insufficient documentation

## 2021-08-19 DIAGNOSIS — C439 Malignant melanoma of skin, unspecified: Secondary | ICD-10-CM | POA: Insufficient documentation

## 2021-08-19 HISTORY — DX: Pain in unspecified shoulder: M25.519

## 2021-08-19 HISTORY — DX: Radiculopathy, cervical region: M54.12

## 2021-08-20 ENCOUNTER — Encounter: Payer: Self-pay | Admitting: Cardiology

## 2021-08-20 ENCOUNTER — Ambulatory Visit (INDEPENDENT_AMBULATORY_CARE_PROVIDER_SITE_OTHER): Payer: Medicare HMO

## 2021-08-20 ENCOUNTER — Other Ambulatory Visit: Payer: Self-pay

## 2021-08-20 ENCOUNTER — Ambulatory Visit: Payer: Medicare HMO | Admitting: Cardiology

## 2021-08-20 VITALS — BP 108/78 | HR 76 | Ht 64.0 in | Wt 200.0 lb

## 2021-08-20 DIAGNOSIS — R002 Palpitations: Secondary | ICD-10-CM

## 2021-08-20 DIAGNOSIS — I7300 Raynaud's syndrome without gangrene: Secondary | ICD-10-CM | POA: Diagnosis not present

## 2021-08-20 DIAGNOSIS — E782 Mixed hyperlipidemia: Secondary | ICD-10-CM | POA: Diagnosis not present

## 2021-08-20 DIAGNOSIS — I493 Ventricular premature depolarization: Secondary | ICD-10-CM

## 2021-08-20 HISTORY — DX: Palpitations: R00.2

## 2021-08-20 HISTORY — DX: Ventricular premature depolarization: I49.3

## 2021-08-20 NOTE — Patient Instructions (Signed)
Medication Instructions:  Your physician recommends that you continue on your current medications as directed. Please refer to the Current Medication list given to you today.  *If you need a refill on your cardiac medications before your next appointment, please call your pharmacy*   Lab Work: None If you have labs (blood work) drawn today and your tests are completely normal, you will receive your results only by: [place] (if you have MyChart) OR A paper copy in the mail If you have any lab test that is abnormal or we need to change your treatment, we will call you to review the results.   Testing/Procedures: Your physician has requested that you have an echocardiogram. Echocardiography is a painless test that uses sound waves to create images of your heart. It provides your doctor with information about the size and shape of your heart and how well your heart's chambers and valves are working. This procedure takes approximately one hour. There are no restrictions for this procedure.   ZIO XT- Long Term Monitor Instructions  Your physician has requested you wear a ZIO patch monitor for 14 days.  This is a single patch monitor. [employer] supplies one patch monitor per enrollment. Additional stickers are not available. Please do not apply patch if you will be having a Nuclear Stress Test,  Echocardiogram, Cardiac CT, MRI, or Chest Xray during the period you would be wearing the  monitor. The patch cannot be worn during these tests. You cannot remove and re-apply the  ZIO XT patch monitor.  Your ZIO patch monitor will be mailed 3 day USPS to your address on file. It may take 3-5 days  to receive your monitor after you have been enrolled.  Once you have received your monitor, please review the enclosed instructions. Your monitor  has already been registered assigning a specific monitor serial # to you.  Applying the monitor  We recommend signing up for the patient portal called  "MyChart".  Sign up information is provided on this After Visit Summary.  MyChart is used to connect with patients for Virtual Visits (Telemedicine).  Patients are able to view lab/test results, encounter notes, upcoming appointments, etc.  Non-urgent messages can be sent to your provider as well.   To learn more about what you can do with MyChart, go to [email or web address].    Your next appointment:   2 months   The format for your next appointment:   In Person  Provider:   Jenne Campus, MD    Other Instructions

## 2021-08-20 NOTE — Progress Notes (Signed)
Cardiology Consultation:    Date:  08/20/2021   ID:  ARES TEGTMEYER, DOB 02-19-1956, MRN [id number]  PCP:  Nicoletta Dress, MD  Cardiologist:  Jenne Campus, MD   Referring MD: Nicoletta Dress, MD   Chief Complaint  Patient presents with   Paroxysmal Afib    08/17/21 ECG    History of Present Illness:    Beth Soto is a 65 y.o. female who is being seen today for the evaluation of palpitations at the request of Nicoletta Dress, MD. past medical history significant for Raynaud's phenomenon, palpitations, thyroid disease with hypothyroidism, dyslipidemia.  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  Few days ago she woke up in the morning feeling not right her she felt her heart skipping and beating irregularly she also was sweaty.  She denies having any chest pain tightness squeezing pressure burning chest.  She went to Dr. Elissa Hefty who did EKG and apparently at the time she went there as everything was elevated stable and subsided.  He called me and he told me that she was in atrial fibrillation, however if she said that the time she arrived to his office she was already fine.  She did check her pulse oximetry before going to his office and she noted to have irregular heartbeats with some slow heart rate down to 35.  Overall she is doing quite well she is fairly active she try to walk every single day with the dog and she has no difficulty doing it however she noticed some increased shortness of breath over the last year or so.  Denies having atypical chest pain tightness squeezing pressure burning chest no palpitations except for episode described above. She does not smoke She is not on any diet  Past Medical History:  Diagnosis Date   Abscess of skin of right shoulder 10/01/2020   Arthritis    Cancer ([facility])    melanoma   Cervical radiculopathy 08/19/2021   Gastric ulcer    Hyperlipidemia     Malignant melanoma ([place])    Raynaud's disease without gangrene    Shoulder pain 08/19/2021   Thyroid disease    Tuberculosis    65 years old; on INS for 1 year    Past Surgical History:  Procedure Laterality Date   COLONOSCOPY     HAMMER TOE SURGERY     MELANOMA EXCISION     TOTAL HIP ARTHROPLASTY Right 04/2020    Current Medications: Current Meds  Medication Sig   acyclovir (ZOVIRAX) 400 MG tablet Take 400 mg by mouth as needed (cold sores).   atorvastatin (LIPITOR) 20 MG tablet Take 20 mg by mouth at bedtime.   Calcium Carb-Cholecalciferol (CALCIUM 600 + D PO) Take 1 tablet by mouth daily.   Cholecalciferol (VITAMIN D3 PO) Take 2,000 Units by mouth daily.   dicyclomine (BENTYL) 20 MG tablet Take 20 mg by mouth every 6 (six) hours.   folic acid (FOLVITE) 1 MG tablet Take 1 mg by mouth daily.   levothyroxine (SYNTHROID) 150 MCG tablet Take 150 mcg by mouth daily before breakfast.   Current Facility-Administered Medications for the 08/20/21 encounter (Office Visit) with Park Liter, MD  Medication   0.9 %  sodium chloride infusion     Allergies:   Patient has no known allergies.   Social History   Socioeconomic History   Marital status: Widowed    Spouse  name: Not on file   Number of children: 1   Years of education: Not on file   Highest education level: Not on file  Occupational History   Occupation: retired Charity fundraiser  Tobacco Use   Smoking status: Never   Smokeless tobacco: Never  Vaping Use   Vaping Use: Never used  Substance and Sexual Activity   Alcohol use: Yes    Comment: occassioally   Drug use: No   Sexual activity: Not on file  Other Topics Concern   Not on file  Social History Narrative   Not on file   Social Determinants of Health   Financial Resource Strain: Not on file  Food Insecurity: Not on file  Transportation Needs: Not on file  Physical Activity: Not on file  Stress: Not on file  Social Connections: Not on file      Family History: The patient's family history includes Atrial fibrillation in her father; Bladder Cancer in her brother; COPD in her brother; Colon polyps in her mother; Diabetes in her brother; Heart disease in her father and mother; Heart failure in her brother, father, and mother; Hepatitis C in her brother; Hypertension in her mother; Stroke in her father and mother. There is no history of Colon cancer, Esophageal cancer, Rectal cancer, or Stomach cancer. ROS:   Please see the history of present illness.    All 14 point review of systems negative except as described per history of present illness.  EKGs/Labs/Other Studies Reviewed:    The following studies were reviewed today: I did review record from her primary care physician but I do not have EKG.  Will request copy of it  EKG:  EKG is  ordered today.  The ekg ordered today demonstrates normal sinus rhythm, normal P interval, PVCs look like the origin of the PVCs RVOT.  Recent Labs: No results found for requested labs within last 8760 hours.  Recent Lipid Panel No results found for: CHOL, TRIG, HDL, CHOLHDL, VLDL, LDLCALC, LDLDIRECT  Physical Exam:    VS:  BP 108/78 (BP Location: Right Arm, Patient Position: Sitting)   Pulse 76   Ht 5\' 4"  (1.626 m)   Wt 200 lb (90.7 kg)   SpO2 100%   BMI 34.33 kg/m     Wt Readings from Last 3 Encounters:  08/20/21 200 lb (90.7 kg)  07/07/21 200 lb (90.7 kg)  03/10/21 200 lb (90.7 kg)     GEN:  Well nourished, well developed in no acute distress HEENT: Normal NECK: No JVD; No carotid bruits LYMPHATICS: No lymphadenopathy CARDIAC: RRR, no murmurs, no rubs, no gallops RESPIRATORY:  Clear to auscultation without rales, wheezing or rhonchi  ABDOMEN: Soft, non-tender, non-distended MUSCULOSKELETAL:  No edema; No deformity  SKIN: Warm and dry NEUROLOGIC:  Alert and oriented x 3 PSYCHIATRIC:  Normal affect   ASSESSMENT:    1. Raynaud's disease without gangrene   2. Palpitations    3. Ventricular ectopy   4. Mixed hyperlipidemia    PLAN:    In order of problems listed above:  Palpitations.  (There is diagnosis of atrial fibrillation but have no documentation of this.  I will call primary care physician to get a copy of her EKG to see if truly if this is atrial fibrillation.  From the description she gave me look like she may have had frequent ventricular ectopy.  On the EKG today I do see some ventricular ectopy with PVCs complex suggesting RVOT ectopy.  The plan will be  to wear Zio patch for 2 weeks to see if she have any recurrences of arrhythmia.  As a part of evaluation echocardiogram will be done to look at left ventricle ejection fraction.  I called also primary care physician to get her Chem-7.  I will not initiate any therapy since we not sure exactly what we are treating it. Ventricular ectopy monitor will be placed to see the burden of this arrhythmia. Mixed dyslipidemia, she in does have LDL of 71 HDL 74 is a good cholesterol profile and she is on atorvastatin which I will continue. In the future we will may consider evaluation for coronary artery disease however first I want to get echocardiogram and Zio patch results before reaching for dose test based on echocardiogram and Zio patch will decide what test to run for coronary artery disease check.  Luckily overall she does not have much symptoms.  She never passed out.  Sometimes she get dizzy but that is rather rare when she gets up   Medication Adjustments/Labs and Tests Ordered: Current medicines are reviewed at length with the patient today.  Concerns regarding medicines are outlined above.  No orders of the defined types were placed in this encounter.  No orders of the defined types were placed in this encounter.   Signed, Park Liter, MD, [facility]. 08/20/2021 10:59 AM    [place]

## 2021-08-23 ENCOUNTER — Telehealth: Payer: Self-pay | Admitting: Cardiology

## 2021-08-23 DIAGNOSIS — R002 Palpitations: Secondary | ICD-10-CM

## 2021-08-23 NOTE — Telephone Encounter (Signed)
  Pt said she have her heart monitor put in last Friday and it came off yesterday, she would like to stop by at [facility] office today, she needs help to put back on. She wanted to know what time she can stop by

## 2021-08-23 NOTE — Telephone Encounter (Signed)
Spoke to patient she is coming tomorrow to have monitor put back on.

## 2021-08-24 ENCOUNTER — Ambulatory Visit: Payer: Medicare HMO

## 2021-08-24 ENCOUNTER — Telehealth: Payer: Self-pay | Admitting: *Deleted

## 2021-08-24 DIAGNOSIS — R002 Palpitations: Secondary | ICD-10-CM

## 2021-08-24 NOTE — Telephone Encounter (Signed)
Pt came in today to get another zio monitor put on due to other one falling off after 2 days. She will mail other one out today and I let Almyra Free at [facility] know about the new serial number we have used which is: [id number] Once they get result of this monitor they will combine the 2 results and send in one report.

## 2021-09-01 ENCOUNTER — Other Ambulatory Visit: Payer: Self-pay

## 2021-09-01 ENCOUNTER — Ambulatory Visit (INDEPENDENT_AMBULATORY_CARE_PROVIDER_SITE_OTHER): Payer: Medicare HMO

## 2021-09-01 DIAGNOSIS — I493 Ventricular premature depolarization: Secondary | ICD-10-CM

## 2021-09-01 DIAGNOSIS — I517 Cardiomegaly: Secondary | ICD-10-CM | POA: Diagnosis not present

## 2021-09-01 DIAGNOSIS — I7300 Raynaud's syndrome without gangrene: Secondary | ICD-10-CM

## 2021-09-01 DIAGNOSIS — I5030 Unspecified diastolic (congestive) heart failure: Secondary | ICD-10-CM

## 2021-09-01 DIAGNOSIS — E782 Mixed hyperlipidemia: Secondary | ICD-10-CM | POA: Diagnosis not present

## 2021-09-01 DIAGNOSIS — R002 Palpitations: Secondary | ICD-10-CM | POA: Diagnosis not present

## 2021-09-01 LAB — ECHOCARDIOGRAM COMPLETE
Area-P 1/2: 3.93 cm2
S' Lateral: 3.3 cm

## 2021-09-06 ENCOUNTER — Telehealth: Payer: Self-pay | Admitting: Cardiology

## 2021-09-06 ENCOUNTER — Encounter: Payer: Self-pay | Admitting: Cardiology

## 2021-09-06 NOTE — Telephone Encounter (Signed)
Patient returned call for test results.  °

## 2021-09-06 NOTE — Telephone Encounter (Signed)
Called patient informed her of results.  

## 2021-09-07 DIAGNOSIS — I7300 Raynaud's syndrome without gangrene: Secondary | ICD-10-CM

## 2021-09-07 DIAGNOSIS — E782 Mixed hyperlipidemia: Secondary | ICD-10-CM | POA: Diagnosis not present

## 2021-09-07 DIAGNOSIS — I493 Ventricular premature depolarization: Secondary | ICD-10-CM

## 2021-09-07 DIAGNOSIS — R002 Palpitations: Secondary | ICD-10-CM

## 2021-09-16 DIAGNOSIS — E782 Mixed hyperlipidemia: Secondary | ICD-10-CM | POA: Diagnosis not present

## 2021-09-16 DIAGNOSIS — I493 Ventricular premature depolarization: Secondary | ICD-10-CM | POA: Diagnosis not present

## 2021-09-16 DIAGNOSIS — R002 Palpitations: Secondary | ICD-10-CM | POA: Diagnosis not present

## 2021-09-21 ENCOUNTER — Other Ambulatory Visit: Payer: Self-pay

## 2021-09-21 ENCOUNTER — Ambulatory Visit (INDEPENDENT_AMBULATORY_CARE_PROVIDER_SITE_OTHER): Payer: Medicare HMO | Admitting: Cardiology

## 2021-09-21 ENCOUNTER — Encounter: Payer: Self-pay | Admitting: Cardiology

## 2021-09-21 ENCOUNTER — Telehealth: Payer: Self-pay | Admitting: Emergency Medicine

## 2021-09-21 VITALS — BP 136/72 | HR 75 | Ht 64.0 in | Wt 199.2 lb

## 2021-09-21 DIAGNOSIS — I4729 Other ventricular tachycardia: Secondary | ICD-10-CM

## 2021-09-21 DIAGNOSIS — E782 Mixed hyperlipidemia: Secondary | ICD-10-CM

## 2021-09-21 DIAGNOSIS — R0609 Other forms of dyspnea: Secondary | ICD-10-CM | POA: Insufficient documentation

## 2021-09-21 DIAGNOSIS — E079 Disorder of thyroid, unspecified: Secondary | ICD-10-CM

## 2021-09-21 DIAGNOSIS — I493 Ventricular premature depolarization: Secondary | ICD-10-CM

## 2021-09-21 HISTORY — DX: Other ventricular tachycardia: I47.29

## 2021-09-21 HISTORY — DX: Other forms of dyspnea: R06.09

## 2021-09-21 MED ORDER — METOPROLOL TARTRATE 25 MG PO TABS: 25.0000 mg | ORAL_TABLET | Freq: Two times a day (BID) | ORAL | 1 refills | Status: DC

## 2021-09-21 NOTE — Progress Notes (Signed)
Cardiology Office Note:    Date:  09/21/2021   ID:  Beth Soto, DOB 06-11-1956, MRN [id number]  PCP:  Nicoletta Dress, MD  Cardiologist:  Jenne Campus, MD    Referring MD: Nicoletta Dress, MD   Chief Complaint  Patient presents with   Monitor results    History of Present Illness:    Beth Soto is a 65 y.o. female who came to me initially because of palpitations.  She did wear a monitor surprisingly monitor showed 3 episode of nonsustained ventricular tachycardia which was asymptomatic.  She also got some ventricular ectopy.  Echocardiogram performed showed preserved left ventricle ejection fraction.  Her past medical history is also significant for Raynaud's phenomenon, cervical radiculopathy, hyperlipidemia, dyspnea on exertion.  She is coming today to discuss the finding on the monitor.  She denies have any chest pain tightness squeezing pressure burning chest no dizziness no passing out.   Past Medical History:  Diagnosis Date   Abscess of skin of right shoulder 10/01/2020   Arthritis    Cancer ([facility])    melanoma   Cervical radiculopathy 08/19/2021   Gastric ulcer    Hyperlipidemia    Malignant melanoma ([place])    Raynaud's disease without gangrene    Shoulder pain 08/19/2021   Thyroid disease    Tuberculosis    65 years old; on INS for 1 year    Past Surgical History:  Procedure Laterality Date   COLONOSCOPY     HAMMER TOE SURGERY     MELANOMA EXCISION     TOTAL HIP ARTHROPLASTY Right 04/2020    Current Medications: Current Meds  Medication Sig   acyclovir (ZOVIRAX) 400 MG tablet Take 400 mg by mouth as needed (cold sores).   atorvastatin (LIPITOR) 20 MG tablet Take 20 mg by mouth at bedtime.   Calcium Carb-Cholecalciferol (CALCIUM 600 + D PO) Take 1 tablet by mouth daily. Unknown strength   Cholecalciferol (VITAMIN D3 PO) Take 2,000 Units by mouth daily.   levothyroxine (SYNTHROID) 150 MCG tablet Take 150 mcg by mouth daily before breakfast.    metoprolol tartrate (LOPRESSOR) 25 MG tablet Take 1 tablet (25 mg total) by mouth 2 (two) times daily.   Current Facility-Administered Medications for the 09/21/21 encounter (Office Visit) with Park Liter, MD  Medication   0.9 %  sodium chloride infusion     Allergies:   Patient has no known allergies.   Social History   Socioeconomic History   Marital status: Widowed    Spouse name: Not on file   Number of children: 1   Years of education: Not on file   Highest education level: Not on file  Occupational History   Occupation: retired Charity fundraiser  Tobacco Use   Smoking status: Never   Smokeless tobacco: Never  Vaping Use   Vaping Use: Never used  Substance and Sexual Activity   Alcohol use: Yes    Comment: occassioally   Drug use: No   Sexual activity: Not on file  Other Topics Concern   Not on file  Social History Narrative   Not on file   Social Determinants of Health   Financial Resource Strain: Not on file  Food Insecurity: Not on file  Transportation Needs: Not on file  Physical Activity: Not on file  Stress: Not on file  Social Connections: Not on file     Family History: The patient's family history includes Atrial fibrillation in her father; Bladder Cancer in her  brother; COPD in her brother; Colon polyps in her mother; Diabetes in her brother; Heart disease in her father and mother; Heart failure in her brother, father, and mother; Hepatitis C in her brother; Hypertension in her mother; Stroke in her father and mother. There is no history of Colon cancer, Esophageal cancer, Rectal cancer, or Stomach cancer. ROS:   Please see the history of present illness.    All 14 point review of systems negative except as described per history of present illness  EKGs/Labs/Other Studies Reviewed:      Recent Labs: No results found for requested labs within last 8760 hours.  Recent Lipid Panel No results found for: CHOL, TRIG, HDL, CHOLHDL, VLDL,  LDLCALC, LDLDIRECT  Physical Exam:    VS:  BP 136/72 (BP Location: Left Arm, Patient Position: Sitting)    Pulse 75    Ht 5\' 4"  (1.626 m)    Wt 199 lb 3.2 oz (90.4 kg)    SpO2 98%    BMI 34.19 kg/m     Wt Readings from Last 3 Encounters:  09/21/21 199 lb 3.2 oz (90.4 kg)  08/20/21 200 lb (90.7 kg)  07/07/21 200 lb (90.7 kg)     GEN:  Well nourished, well developed in no acute distress HEENT: Normal NECK: No JVD; No carotid bruits LYMPHATICS: No lymphadenopathy CARDIAC: RRR, no murmurs, no rubs, no gallops RESPIRATORY:  Clear to auscultation without rales, wheezing or rhonchi  ABDOMEN: Soft, non-tender, non-distended MUSCULOSKELETAL:  No edema; No deformity  SKIN: Warm and dry LOWER EXTREMITIES: no swelling NEUROLOGIC:  Alert and oriented x 3 PSYCHIATRIC:  Normal affect   ASSESSMENT:    1. Ventricular ectopy   2. Nonsustained ventricular tachycardia   3. Dyspnea on exertion   4. Thyroid disease   5. Mixed hyperlipidemia    PLAN:    In order of problems listed above:  Ventricular ectopy with nonsustained ventricular tachycardia which is new discovery obviously that need to be stratified.  Her echocardiogram showed preserved left ventricle ejection fraction however we do not have assessment of coronary artery disease, therefore, I will schedule her to have coronary CT angio make sure she does not have obstructive coronary artery disease.  In the meantime we initiated therapy with beta-blocker.  I told her if she get dizzy or has passed out she is to let me know immediately. Dyspnea on exertion echocardiogram showed preserved ejection fraction but I will do coronary CT angio to rule out coronary artery disease as anginal equivalent symptomatology. Thyroid disease and her TSH is normal in August 17, 2021 Dyslipidemia I did review K PN which show me LDL 71 HDL 74 this is on Lipitor 20 which I will continue.   Medication Adjustments/Labs and Tests Ordered: Current medicines are  reviewed at length with the patient today.  Concerns regarding medicines are outlined above.  No orders of the defined types were placed in this encounter.  Medication changes: No orders of the defined types were placed in this encounter.   Signed, Park Liter, MD, [facility] 09/21/2021 12:54 PM    [place]

## 2021-09-21 NOTE — Telephone Encounter (Signed)
Spoke to patient. Informed her of results. Will send in metoprolol tartrate 25 mg twice daily. She will discuss further Dr. Agustin Cree at her visit today.

## 2021-09-21 NOTE — Patient Instructions (Signed)
Medication Instructions:  Your physician recommends that you continue on your current medications as directed. Please refer to the Current Medication list given to you today.  *If you need a refill on your cardiac medications before your next appointment, please call your pharmacy*   Lab Work: Your physician recommends that you return for lab work 3-7 days before ct: BMP   If you have labs (blood work) drawn today and your tests are completely normal, you will receive your results only by: [place] (if you have MyChart) OR A paper copy in the mail If you have any lab test that is abnormal or we need to change your treatment, we will call you to review the results.   Testing/Procedures:   Your cardiac CT will be scheduled at one of the below locations:   [facility] [address] [place], [place] [address] [phone]  [place] [address] [place], [place] [address] [phone]  If scheduled at [facility], please arrive at the [facility] main entrance (entrance A) of [facility] 30 minutes prior to test start time. You can use the FREE valet parking offered at the main entrance (encouraged to control the heart rate for the test) Proceed to the [facility] Radiology Department (first floor) to check-in and test prep.  If scheduled at [facility], please arrive 15 mins early for check-in and test prep.  Please follow these instructions carefully (unless otherwise directed):   On the Night Before the Test: Be sure to Drink plenty of water. Do not consume any caffeinated/decaffeinated beverages or chocolate 12 hours prior to your test. Do not take any antihistamines 12 hours prior to your test.  On the Day of the Test: Drink plenty of water until 1 hour prior to the test. Do not eat any food 4 hours prior to the test. You may take your regular  medications prior to the test.  Take metoprolol (Lopressor) two hours prior to test.  FEMALES- please wear underwire-free bra if available, avoid dresses & tight clothing         After the Test: Drink plenty of water. After receiving IV contrast, you may experience a mild flushed feeling. This is normal. On occasion, you may experience a mild rash up to 24 hours after the test. This is not dangerous. If this occurs, you can take Benadryl 25 mg and increase your fluid intake. If you experience trouble breathing, this can be serious. If it is severe call 911 IMMEDIATELY. If it is mild, please call our office. If you take any of these medications: Glipizide/Metformin, Avandament, Glucavance, please do not take 48 hours after completing test unless otherwise instructed.  Please allow 2-4 weeks for scheduling of routine cardiac CTs. Some insurance companies require a pre-authorization which may delay scheduling of this test.   For non-scheduling related questions, please contact the cardiac imaging nurse navigator should you have any questions/concerns: Marchia Bond, Cardiac Imaging Nurse Navigator Gordy Clement, Cardiac Imaging Nurse Navigator [facility] Direct Office Dial: [phone]   For scheduling needs, including cancellations and rescheduling, please call [place], [phone].    Follow-Up: At [facility], you and your health needs are our priority.  As part of our continuing mission to provide you with exceptional heart care, we have created designated Provider Care Teams.  These Care Teams include your primary Cardiologist (physician) and Advanced Practice Providers (APPs -  Physician Assistants and Nurse Practitioners) who all work together to provide you with the care you need, when you need it.  We recommend signing up for the patient portal called "MyChart".  Sign up information is provided on this After Visit Summary.  MyChart is used to  connect with patients for Virtual Visits (Telemedicine).  Patients are able to view lab/test results, encounter notes, upcoming appointments, etc.  Non-urgent messages can be sent to your provider as well.   To learn more about what you can do with MyChart, go to [email or web address].    Your next appointment:   3 month(s)  The format for your next appointment:   In Person  Provider:   Jenne Campus, MD    Other Instructions  Cardiac CT Angiogram A cardiac CT angiogram is a procedure to look at the heart and the area around the heart. It may be done to help find the cause of chest pains or other symptoms of heart disease. During this procedure, a substance called contrast dye is injected into the blood vessels in the area to be checked. A large X-ray machine, called a CT scanner, then takes detailed pictures of the heart and the surrounding area. The procedure is also sometimes called a coronary CT angiogram, coronary artery scanning, or CTA. A cardiac CT angiogram allows the health care provider to see how well blood is flowing to and from the heart. The health care provider will be able to see if there are any problems, such as: Blockage or narrowing of the coronary arteries in the heart. Fluid around the heart. Signs of weakness or disease in the muscles, valves, and tissues of the heart. Tell a health care provider about: Any allergies you have. This is especially important if you have had a previous allergic reaction to contrast dye. All medicines you are taking, including vitamins, herbs, eye drops, creams, and over-the-counter medicines. Any blood disorders you have. Any surgeries you have had. Any medical conditions you have. Whether you are pregnant or may be pregnant. Any anxiety disorders, chronic pain, or other conditions you have that may increase your stress or prevent you from lying still. What are the risks? Generally, this is a safe procedure. However, problems  may occur, including: Bleeding. Infection. Allergic reactions to medicines or dyes. Damage to other structures or organs. Kidney damage from the contrast dye that is used. Increased risk of cancer from radiation exposure. This risk is low. Talk with your health care provider about: The risks and benefits of testing. How you can receive the lowest dose of radiation. What happens before the procedure? Wear comfortable clothing and remove any jewelry, glasses, dentures, and hearing aids. Follow instructions from your health care provider about eating and drinking. This may include: For 12 hours before the procedure -- avoid caffeine. This includes tea, coffee, soda, energy drinks, and diet pills. Drink plenty of water or other fluids that do not have caffeine in them. Being well hydrated can prevent complications. For 4-6 hours before the procedure -- stop eating and drinking. The contrast dye can cause nausea, but this is less likely if your stomach is empty. Ask your health care provider about changing or stopping your regular medicines. This is especially important if you are taking diabetes medicines, blood thinners, or medicines to treat problems with erections (erectile dysfunction). What happens during the procedure?  Hair on your chest may need to be removed so that small sticky patches called electrodes can be placed on your chest.  These will transmit information that helps to monitor your heart during the procedure. An IV will be inserted into one of your veins. You might be given a medicine to control your heart rate during the procedure. This will help to ensure that good images are obtained. You will be asked to lie on an exam table. This table will slide in and out of the CT machine during the procedure. Contrast dye will be injected into the IV. You might feel warm, or you may get a metallic taste in your mouth. You will be given a medicine called nitroglycerin. This will relax or  dilate the arteries in your heart. The table that you are lying on will move into the CT machine tunnel for the scan. The person running the machine will give you instructions while the scans are being done. You may be asked to: Keep your arms above your head. Hold your breath. Stay very still, even if the table is moving. When the scanning is complete, you will be moved out of the machine. The IV will be removed. The procedure may vary among health care providers and hospitals. What can I expect after the procedure? After your procedure, it is common to have: A metallic taste in your mouth from the contrast dye. A feeling of warmth. A headache from the nitroglycerin. Follow these instructions at home: Take over-the-counter and prescription medicines only as told by your health care provider. If you are told, drink enough fluid to keep your urine pale yellow. This will help to flush the contrast dye out of your body. Most people can return to their normal activities right after the procedure. Ask your health care provider what activities are safe for you. It is up to you to get the results of your procedure. Ask your health care provider, or the department that is doing the procedure, when your results will be ready. Keep all follow-up visits as told by your health care provider. This is important. Contact a health care provider if: You have any symptoms of allergy to the contrast dye. These include: Shortness of breath. Rash or hives. A racing heartbeat. Summary A cardiac CT angiogram is a procedure to look at the heart and the area around the heart. It may be done to help find the cause of chest pains or other symptoms of heart disease. During this procedure, a large X-ray machine, called a CT scanner, takes detailed pictures of the heart and the surrounding area after a contrast dye has been injected into blood vessels in the area. Ask your health care provider about changing or  stopping your regular medicines before the procedure. This is especially important if you are taking diabetes medicines, blood thinners, or medicines to treat erectile dysfunction. If you are told, drink enough fluid to keep your urine pale yellow. This will help to flush the contrast dye out of your body. This information is not intended to replace advice given to you by your health care provider. Make sure you discuss any questions you have with your health care provider. Document Revised: 06/09/2021 Document Reviewed: 05/22/2019 Elsevier Patient Education  2022 [employer].

## 2021-09-21 NOTE — Telephone Encounter (Signed)
-----   Message from Park Liter, MD sent at 09/20/2021 11:35 AM EST ----- Monitor showed episodes of ventricular tachycardia 3 episodes with the fastest rate of 231.  Please start metoprolol titrate 25 twice daily and she should have a follow-up with me within the next month or 2 to talk about it

## 2021-10-07 ENCOUNTER — Telehealth (HOSPITAL_COMMUNITY): Payer: Self-pay | Admitting: *Deleted

## 2021-10-07 DIAGNOSIS — I493 Ventricular premature depolarization: Secondary | ICD-10-CM | POA: Diagnosis not present

## 2021-10-07 DIAGNOSIS — I4729 Other ventricular tachycardia: Secondary | ICD-10-CM | POA: Diagnosis not present

## 2021-10-07 DIAGNOSIS — E079 Disorder of thyroid, unspecified: Secondary | ICD-10-CM | POA: Diagnosis not present

## 2021-10-07 DIAGNOSIS — R0609 Other forms of dyspnea: Secondary | ICD-10-CM | POA: Diagnosis not present

## 2021-10-07 DIAGNOSIS — E782 Mixed hyperlipidemia: Secondary | ICD-10-CM | POA: Diagnosis not present

## 2021-10-07 NOTE — Telephone Encounter (Signed)
Reaching out to patient to offer assistance regarding upcoming cardiac imaging study; pt verbalizes understanding of appt date/time, parking situation and where to check in, pre-test NPO status and medications ordered, and verified current allergies; name and call back number provided for further questions should they arise  Gordy Clement RN Navigator Cardiac Imaging [facility] [phone] office [phone] cell  Patient to take 50mg  metoprolol tartrate two hours prior to cardia CT scan. She is ware to obtain blood work prior to test and to arrive at 9am for her 9:30am scan.

## 2021-10-08 LAB — BASIC METABOLIC PANEL
BUN/Creatinine Ratio: 28 (ref 12–28)
BUN: 20 mg/dL (ref 8–27)
CO2: 24 mmol/L (ref 20–29)
Calcium: 9.4 mg/dL (ref 8.7–10.3)
Chloride: 103 mmol/L (ref 96–106)
Creatinine, Ser: 0.71 mg/dL (ref 0.57–1.00)
Glucose: 92 mg/dL (ref 70–99)
Potassium: 4.6 mmol/L (ref 3.5–5.2)
Sodium: 140 mmol/L (ref 134–144)
eGFR: 93 mL/min/{1.73_m2} (ref >59)

## 2021-10-12 ENCOUNTER — Ambulatory Visit (HOSPITAL_COMMUNITY)
Admission: RE | Admit: 2021-10-12 | Discharge: 2021-10-12 | Disposition: A | Discharge status: Home / Self Care | Payer: Medicare HMO | Source: Ambulatory Visit | Attending: Cardiology | Admitting: Cardiology

## 2021-10-12 ENCOUNTER — Encounter (HOSPITAL_COMMUNITY): Payer: Self-pay

## 2021-10-12 ENCOUNTER — Other Ambulatory Visit: Payer: Self-pay

## 2021-10-12 DIAGNOSIS — R0609 Other forms of dyspnea: Secondary | ICD-10-CM | POA: Insufficient documentation

## 2021-10-12 DIAGNOSIS — E079 Disorder of thyroid, unspecified: Secondary | ICD-10-CM | POA: Diagnosis not present

## 2021-10-12 DIAGNOSIS — E782 Mixed hyperlipidemia: Secondary | ICD-10-CM | POA: Insufficient documentation

## 2021-10-12 DIAGNOSIS — I4729 Other ventricular tachycardia: Secondary | ICD-10-CM | POA: Insufficient documentation

## 2021-10-12 DIAGNOSIS — I498 Other specified cardiac arrhythmias: Secondary | ICD-10-CM

## 2021-10-12 DIAGNOSIS — I493 Ventricular premature depolarization: Secondary | ICD-10-CM | POA: Insufficient documentation

## 2021-10-12 DIAGNOSIS — I2510 Atherosclerotic heart disease of native coronary artery without angina pectoris: Secondary | ICD-10-CM | POA: Insufficient documentation

## 2021-10-12 MED ORDER — NITROGLYCERIN 0.4 MG SL SUBL
SUBLINGUAL_TABLET | SUBLINGUAL | Status: AC
  Filled 2021-10-12: qty 2

## 2021-10-12 MED ORDER — NITROGLYCERIN 0.4 MG SL SUBL
0.8000 mg | SUBLINGUAL_TABLET | Freq: Once | SUBLINGUAL | Status: AC
  Administered 2021-10-12: 0.8 mg via SUBLINGUAL

## 2021-10-12 MED ORDER — IOHEXOL 350 MG/ML SOLN
95.0000 mL | Freq: Once | INTRAVENOUS | Status: AC | PRN
  Administered 2021-10-12: 95 mL via INTRAVENOUS

## 2021-10-13 DIAGNOSIS — L209 Atopic dermatitis, unspecified: Secondary | ICD-10-CM | POA: Diagnosis not present

## 2021-10-13 DIAGNOSIS — L299 Pruritus, unspecified: Secondary | ICD-10-CM | POA: Diagnosis not present

## 2021-10-14 ENCOUNTER — Encounter: Payer: Self-pay | Admitting: Cardiology

## 2021-12-22 ENCOUNTER — Ambulatory Visit: Payer: Medicare HMO | Admitting: Cardiology

## 2021-12-22 ENCOUNTER — Encounter: Payer: Self-pay | Admitting: Cardiology

## 2021-12-22 ENCOUNTER — Other Ambulatory Visit: Payer: Self-pay

## 2021-12-22 VITALS — BP 132/74 | HR 61 | Ht 64.0 in | Wt 199.4 lb

## 2021-12-22 DIAGNOSIS — I493 Ventricular premature depolarization: Secondary | ICD-10-CM

## 2021-12-22 DIAGNOSIS — I4729 Other ventricular tachycardia: Secondary | ICD-10-CM

## 2021-12-22 DIAGNOSIS — Z01419 Encounter for gynecological examination (general) (routine) without abnormal findings: Secondary | ICD-10-CM | POA: Diagnosis not present

## 2021-12-22 DIAGNOSIS — R0609 Other forms of dyspnea: Secondary | ICD-10-CM

## 2021-12-22 DIAGNOSIS — E782 Mixed hyperlipidemia: Secondary | ICD-10-CM

## 2021-12-22 MED ORDER — METOPROLOL SUCCINATE ER 50 MG PO TB24: 75.0000 mg | ORAL_TABLET | Freq: Every day | ORAL | 3 refills | Status: DC

## 2021-12-22 NOTE — Progress Notes (Signed)
?Cardiology Office Note:   ? ?Date:  12/22/2021  ? ?ID:  Beth Soto, DOB June 13, 1956, MRN [id number] ? ?PCP:  Nicoletta Dress, MD  ?Cardiologist:  Jenne Campus, MD   ? ?Referring MD: Nicoletta Dress, MD  ? ?Chief Complaint  ?Patient presents with  ? Follow-up  ?I am doing fine ? ?History of Present Illness:   ? ?Beth Soto is a 66 y.o. female who was referred originally to [place] because of palpitations.  Monitor has been placed on showed some PVCs and surprisingly runs of nonsustained ventricular tachycardia.  Stratification work-up for this included echocardiogram which showed preserved left ventricle ejection fraction as well as coronary CT angio with calcium score being 0 and no evidence of any coronary artery disease.  She was put on beta-blocker with good response.  She also has some history of Raynaud's phenomenon, hyperlipidemia history of melanoma. ?Comes today 2 months of follow-up.  Overall she is doing very well.  She denies have any chest pain, tightness, pressure, burning in her chest.  Palpitations are very rare and nonsustained. ? ?Past Medical History:  ?Diagnosis Date  ? Abscess of skin of right shoulder 10/01/2020  ? Arthritis   ? Cancer [facility])   ? melanoma  ? Cervical radiculopathy 08/19/2021  ? Gastric ulcer   ? Hyperlipidemia   ? Malignant melanoma ([place])   ? Raynaud's disease without gangrene   ? Shoulder pain 08/19/2021  ? Thyroid disease   ? Tuberculosis   ? 66 years old; on INS for 1 year  ? ? ?Past Surgical History:  ?Procedure Laterality Date  ? COLONOSCOPY    ? HAMMER TOE SURGERY    ? MELANOMA EXCISION    ? TOTAL HIP ARTHROPLASTY Right 04/2020  ? ? ?Current Medications: ?Current Meds  ?Medication Sig  ? acyclovir (ZOVIRAX) 400 MG tablet Take 400 mg by mouth as needed (cold sores).  ? atorvastatin (LIPITOR) 20 MG tablet Take 20 mg by mouth at bedtime.  ? Calcium Carb-Cholecalciferol (CALCIUM 600 + D PO) Take 1 tablet by mouth daily. Unknown strength  ? Cholecalciferol (VITAMIN D3  PO) Take 2,000 Units by mouth daily.  ? levothyroxine (SYNTHROID) 150 MCG tablet Take 150 mcg by mouth daily before breakfast.  ? metoprolol tartrate (LOPRESSOR) 25 MG tablet Take 1 tablet (25 mg total) by mouth 2 (two) times daily.  ? ?Current Facility-Administered Medications for the 12/22/21 encounter (Office Visit) with Park Liter, MD  ?Medication  ? 0.9 %  sodium chloride infusion  ?  ? ?Allergies:   Patient has no known allergies.  ? ?Social History  ? ?Socioeconomic History  ? Marital status: Widowed  ?  Spouse name: Not on file  ? Number of children: 1  ? Years of education: Not on file  ? Highest education level: Not on file  ?Occupational History  ? Occupation: retired [employer] fundraiser  ?Tobacco Use  ? Smoking status: Never  ? Smokeless tobacco: Never  ?Vaping Use  ? Vaping Use: Never used  ?Substance and Sexual Activity  ? Alcohol use: Yes  ?  Comment: occassioally  ? Drug use: No  ? Sexual activity: Not on file  ?Other Topics Concern  ? Not on file  ?Social History Narrative  ? Not on file  ? ?Social Determinants of Health  ? ?Financial Resource Strain: Not on file  ?Food Insecurity: Not on file  ?Transportation Needs: Not on file  ?Physical Activity: Not on file  ?Stress: Not on file  ?  Social Connections: Not on file  ?  ? ?Family History: ?The patient's family history includes Atrial fibrillation in her father; Bladder Cancer in her brother; COPD in her brother; Colon polyps in her mother; Diabetes in her brother; Heart disease in her father and mother; Heart failure in her brother, father, and mother; Hepatitis C in her brother; Hypertension in her mother; Stroke in her father and mother. There is no history of Colon cancer, Esophageal cancer, Rectal cancer, or Stomach cancer. ?ROS:   ?Please see the history of present illness.    ?All 14 point review of systems negative except as described per history of present illness ? ?EKGs/Labs/Other Studies Reviewed:   ? ? ? ?Recent  Labs: ?10/07/2021: BUN 20; Creatinine, Ser 0.71; Potassium 4.6; Sodium 140  ?Recent Lipid Panel ?No results found for: CHOL, TRIG, HDL, CHOLHDL, VLDL, LDLCALC, LDLDIRECT ? ?Physical Exam:   ? ?VS:  BP 132/74 (BP Location: Left Arm, Patient Position: Sitting)   Pulse 61   Ht '5\' 4"'$  (1.626 m)   Wt 199 lb 6.4 oz (90.4 kg)   SpO2 98%   BMI 34.23 kg/m?    ? ?Wt Readings from Last 3 Encounters:  ?12/22/21 199 lb 6.4 oz (90.4 kg)  ?09/21/21 199 lb 3.2 oz (90.4 kg)  ?08/20/21 200 lb (90.7 kg)  ?  ? ?GEN:  Well nourished, well developed in no acute distress ?HEENT: Normal ?NECK: No JVD; No carotid bruits ?LYMPHATICS: No lymphadenopathy ?CARDIAC: RRR, no murmurs, no rubs, no gallops ?RESPIRATORY:  Clear to auscultation without rales, wheezing or rhonchi  ?ABDOMEN: Soft, non-tender, non-distended ?MUSCULOSKELETAL:  No edema; No deformity  ?SKIN: Warm and dry ?LOWER EXTREMITIES: no swelling ?NEUROLOGIC:  Alert and oriented x 3 ?PSYCHIATRIC:  Normal affect  ? ?ASSESSMENT:   ? ?1. Nonsustained ventricular tachycardia   ?2. Ventricular ectopy   ?3. Mixed hyperlipidemia   ?4. Dyspnea on exertion   ? ?PLAN:   ? ?In order of problems listed above: ? ?Nonsustained ventricular tachycardia so far stratifications standing as this is fairly benign phenomenon.  I will try to increase beta-blocker.  She is taking metoprolol titrate 25 twice daily I will switch to 75 metoprolol succinate.  I am concerned about her bradycardia and I told her if 75 mg make her weak tired exhausted that wanted to reduce to only 50 mg daily.  I asked her to let me know if she passed out of the dizzy spells. ?Ventricular ectopy: Plan as above. ?Dyslipidemia I did review her K PN which show me her LDL of 71 HDL 74 this is from October 2022.  She is on Lipitor 20 which I will continue. ?Dyspnea on exertion she is exercising on the regular basis doing well overall from that point review. ? ? ?Medication Adjustments/Labs and Tests Ordered: ?Current medicines are  reviewed at length with the patient today.  Concerns regarding medicines are outlined above.  ?No orders of the defined types were placed in this encounter. ? ?Medication changes: No orders of the defined types were placed in this encounter. ? ? ?Signed, ?Park Liter, MD, [facility] ?12/22/2021 10:03 AM    ?[place] ?

## 2021-12-22 NOTE — Patient Instructions (Signed)
Medication Instructions:  ?Your physician has recommended you make the following change in your medication:  ? ?START: Metoprolol succinate 75 mg daily ? ?*If you need a refill on your cardiac medications before your next appointment, please call your pharmacy* ? ? ?Lab Work: ?None ?If you have labs (blood work) drawn today and your tests are completely normal, you will receive your results only by: ?MyChart Message (if you have MyChart) OR ?A paper copy in the mail ?If you have any lab test that is abnormal or we need to change your treatment, we will call you to review the results. ? ? ?Testing/Procedures: ?None ? ? ?Follow-Up: ?At [facility], you and your health needs are our priority.  As part of our continuing mission to provide you with exceptional heart care, we have created designated Provider Care Teams.  These Care Teams include your primary Cardiologist (physician) and Advanced Practice Providers (APPs -  Physician Assistants and Nurse Practitioners) who all work together to provide you with the care you need, when you need it. ? ?We recommend signing up for the patient portal called "MyChart".  Sign up information is provided on this After Visit Summary.  MyChart is used to connect with patients for Virtual Visits (Telemedicine).  Patients are able to view lab/test results, encounter notes, upcoming appointments, etc.  Non-urgent messages can be sent to your provider as well.   ?To learn more about what you can do with MyChart, go to [email or web address].   ? ?Your next appointment:   ?6 month(s) ? ?The format for your next appointment:   ?In Person ? ?Provider:   ?Jenne Campus, MD  ? ? ?Other Instructions ?None ? ?

## 2021-12-22 NOTE — Addendum Note (Signed)
Addended by: Edwyna Shell I on: 12/22/2021 10:14 AM ? ? Modules accepted: Orders ? ?

## 2022-02-02 DIAGNOSIS — E039 Hypothyroidism, unspecified: Secondary | ICD-10-CM | POA: Diagnosis not present

## 2022-02-02 DIAGNOSIS — E785 Hyperlipidemia, unspecified: Secondary | ICD-10-CM | POA: Diagnosis not present

## 2022-02-02 DIAGNOSIS — I4729 Other ventricular tachycardia: Secondary | ICD-10-CM | POA: Diagnosis not present

## 2022-02-02 DIAGNOSIS — R7301 Impaired fasting glucose: Secondary | ICD-10-CM | POA: Diagnosis not present

## 2022-02-09 DIAGNOSIS — Z139 Encounter for screening, unspecified: Secondary | ICD-10-CM | POA: Diagnosis not present

## 2022-02-09 DIAGNOSIS — Z1331 Encounter for screening for depression: Secondary | ICD-10-CM | POA: Diagnosis not present

## 2022-02-09 DIAGNOSIS — Z9181 History of falling: Secondary | ICD-10-CM | POA: Diagnosis not present

## 2022-02-09 DIAGNOSIS — Z0000 Encounter for general adult medical examination without abnormal findings: Secondary | ICD-10-CM | POA: Diagnosis not present

## 2022-02-09 DIAGNOSIS — Z6835 Body mass index (BMI) 35.0-35.9, adult: Secondary | ICD-10-CM | POA: Diagnosis not present

## 2022-02-09 DIAGNOSIS — E669 Obesity, unspecified: Secondary | ICD-10-CM | POA: Diagnosis not present

## 2022-02-09 DIAGNOSIS — Z23 Encounter for immunization: Secondary | ICD-10-CM | POA: Diagnosis not present

## 2022-02-09 DIAGNOSIS — E785 Hyperlipidemia, unspecified: Secondary | ICD-10-CM | POA: Diagnosis not present

## 2022-03-16 DIAGNOSIS — K5900 Constipation, unspecified: Secondary | ICD-10-CM | POA: Diagnosis not present

## 2022-03-16 DIAGNOSIS — Z8249 Family history of ischemic heart disease and other diseases of the circulatory system: Secondary | ICD-10-CM | POA: Diagnosis not present

## 2022-03-16 DIAGNOSIS — I471 Supraventricular tachycardia: Secondary | ICD-10-CM | POA: Diagnosis not present

## 2022-03-16 DIAGNOSIS — E785 Hyperlipidemia, unspecified: Secondary | ICD-10-CM | POA: Diagnosis not present

## 2022-03-16 DIAGNOSIS — E039 Hypothyroidism, unspecified: Secondary | ICD-10-CM | POA: Diagnosis not present

## 2022-03-16 DIAGNOSIS — Z823 Family history of stroke: Secondary | ICD-10-CM | POA: Diagnosis not present

## 2022-03-16 DIAGNOSIS — Z008 Encounter for other general examination: Secondary | ICD-10-CM | POA: Diagnosis not present

## 2022-03-16 DIAGNOSIS — N393 Stress incontinence (female) (male): Secondary | ICD-10-CM | POA: Diagnosis not present

## 2022-03-16 DIAGNOSIS — I10 Essential (primary) hypertension: Secondary | ICD-10-CM | POA: Diagnosis not present

## 2022-03-16 DIAGNOSIS — G4700 Insomnia, unspecified: Secondary | ICD-10-CM | POA: Diagnosis not present

## 2022-03-16 DIAGNOSIS — M329 Systemic lupus erythematosus, unspecified: Secondary | ICD-10-CM | POA: Diagnosis not present

## 2022-03-16 DIAGNOSIS — Z6833 Body mass index (BMI) 33.0-33.9, adult: Secondary | ICD-10-CM | POA: Diagnosis not present

## 2022-03-16 DIAGNOSIS — E669 Obesity, unspecified: Secondary | ICD-10-CM | POA: Diagnosis not present

## 2022-03-17 ENCOUNTER — Other Ambulatory Visit: Payer: Self-pay

## 2022-03-17 ENCOUNTER — Encounter: Payer: Self-pay | Admitting: Cardiology

## 2022-03-17 ENCOUNTER — Other Ambulatory Visit: Payer: Self-pay | Admitting: Cardiology

## 2022-03-17 MED ORDER — METOPROLOL SUCCINATE ER 50 MG PO TB24: 75.0000 mg | ORAL_TABLET | Freq: Every day | ORAL | 3 refills | Status: DC

## 2022-04-29 DIAGNOSIS — Z1231 Encounter for screening mammogram for malignant neoplasm of breast: Secondary | ICD-10-CM | POA: Diagnosis not present

## 2022-06-16 DIAGNOSIS — Z96641 Presence of right artificial hip joint: Secondary | ICD-10-CM | POA: Diagnosis not present

## 2022-06-29 ENCOUNTER — Ambulatory Visit: Payer: Medicare HMO | Attending: Cardiology | Admitting: Cardiology

## 2022-06-29 ENCOUNTER — Encounter: Payer: Self-pay | Admitting: Cardiology

## 2022-06-29 VITALS — BP 122/82 | HR 56 | Ht 64.0 in | Wt 203.4 lb

## 2022-06-29 DIAGNOSIS — R002 Palpitations: Secondary | ICD-10-CM

## 2022-06-29 DIAGNOSIS — E782 Mixed hyperlipidemia: Secondary | ICD-10-CM

## 2022-06-29 DIAGNOSIS — I4729 Other ventricular tachycardia: Secondary | ICD-10-CM

## 2022-06-29 DIAGNOSIS — I493 Ventricular premature depolarization: Secondary | ICD-10-CM

## 2022-06-29 NOTE — Patient Instructions (Signed)

## 2022-06-29 NOTE — Progress Notes (Signed)
Cardiology Office Note:    Date:  06/29/2022   ID:  Beth Soto, DOB 10/13/1955, MRN [id number]  PCP:  Nicoletta Dress, MD  Cardiologist:  Jenne Campus, MD    Referring MD: Nicoletta Dress, MD   Chief Complaint  Patient presents with   Follow-up  Doing fine  History of Present Illness:    Beth Soto is a 66 y.o. female with past medical history significant for palpitations.  That was the original reason for referral to [place].  Monitor has been placed she was find to have PVCs comprising also nonsustained ventricular tachycardia.  Stratification of this arrhythmia included echocardiogram showing preserved left ventricle ejection fraction, she also got a coronary CT angio performed calcium score was 0.  There was no coronary artery disease.  Additional problems include hyperlipidemia on statin, Raynaud's phenomenon, history of melanoma. [name] is coming today Marcello Moores for follow-up.  Overall she is doing very well.  She denies have any chest pain tightness squeezing pressure burning chest, no dizziness no passing out no palpitations.  Past Medical History:  Diagnosis Date   Abscess of skin of right shoulder 10/01/2020   Arthritis    Cancer (HCC)    melanoma   Cervical radiculopathy 08/19/2021   Gastric ulcer    Hyperlipidemia    Malignant melanoma ([place])    Raynaud's disease without gangrene    Shoulder pain 08/19/2021   Thyroid disease    Tuberculosis    66 years old; on INS for 1 year    Past Surgical History:  Procedure Laterality Date   COLONOSCOPY     HAMMER TOE SURGERY     MELANOMA EXCISION     TOTAL HIP ARTHROPLASTY Right 04/2020    Current Medications: Current Meds  Medication Sig   acyclovir (ZOVIRAX) 400 MG tablet Take 400 mg by mouth as needed (cold sores).   atorvastatin (LIPITOR) 20 MG tablet Take 20 mg by mouth at bedtime.   Calcium Carb-Cholecalciferol (CALCIUM 600 + D PO) Take 1 tablet by mouth daily. Unknown strength   Cholecalciferol (VITAMIN  D3 PO) Take 2,000 Units by mouth daily.   levothyroxine (SYNTHROID) 150 MCG tablet Take 150 mcg by mouth daily before breakfast.   metoprolol succinate (TOPROL-XL) 50 MG 24 hr tablet Take 1.5 tablets (75 mg total) by mouth daily. Take with or immediately following a meal.   Current Facility-Administered Medications for the 06/29/22 encounter (Office Visit) with Park Liter, MD  Medication   0.9 %  sodium chloride infusion     Allergies:   Patient has no known allergies.   Social History   Socioeconomic History   Marital status: Widowed    Spouse name: Not on file   Number of children: 1   Years of education: Not on file   Highest education level: Not on file  Occupational History   Occupation: retired Charity fundraiser  Tobacco Use   Smoking status: Never   Smokeless tobacco: Never  Vaping Use   Vaping Use: Never used  Substance and Sexual Activity   Alcohol use: Yes    Comment: occassioally   Drug use: No   Sexual activity: Not on file  Other Topics Concern   Not on file  Social History Narrative   Not on file   Social Determinants of Health   Financial Resource Strain: Not on file  Food Insecurity: Not on file  Transportation Needs: Not on file  Physical Activity: Not on file  Stress: Not on  file  Social Connections: Not on file     Family History: The patient's family history includes Atrial fibrillation in her father; Bladder Cancer in her brother; COPD in her brother; Colon polyps in her mother; Diabetes in her brother; Heart disease in her father and mother; Heart failure in her brother, father, and mother; Hepatitis C in her brother; Hypertension in her mother; Stroke in her father and mother. There is no history of Colon cancer, Esophageal cancer, Rectal cancer, or Stomach cancer. ROS:   Please see the history of present illness.    All 14 point review of systems negative except as described per history of present illness  EKGs/Labs/Other Studies  Reviewed:      Recent Labs: 10/07/2021: BUN 20; Creatinine, Ser 0.71; Potassium 4.6; Sodium 140  Recent Lipid Panel No results found for: "CHOL", "TRIG", "HDL", "CHOLHDL", "VLDL", "LDLCALC", "LDLDIRECT"  Physical Exam:    VS:  BP 122/82 (BP Location: Left Arm, Patient Position: Sitting)   Pulse (!) 56   Ht '5\' 4"'$  (1.626 m)   Wt 203 lb 6.4 oz (92.3 kg)   SpO2 96%   BMI 34.91 kg/m     Wt Readings from Last 3 Encounters:  06/29/22 203 lb 6.4 oz (92.3 kg)  12/22/21 199 lb 6.4 oz (90.4 kg)  09/21/21 199 lb 3.2 oz (90.4 kg)     GEN:  Well nourished, well developed in no acute distress HEENT: Normal NECK: No JVD; No carotid bruits LYMPHATICS: No lymphadenopathy CARDIAC: RRR, no murmurs, no rubs, no gallops RESPIRATORY:  Clear to auscultation without rales, wheezing or rhonchi  ABDOMEN: Soft, non-tender, non-distended MUSCULOSKELETAL:  No edema; No deformity  SKIN: Warm and dry LOWER EXTREMITIES: no swelling NEUROLOGIC:  Alert and oriented x 3 PSYCHIATRIC:  Normal affect   ASSESSMENT:    1. Nonsustained ventricular tachycardia (HCC)   2. Ventricular ectopy   3. Mixed hyperlipidemia   4. Palpitations    PLAN:    In order of problems listed above:  No sustained ventricular tachycardia denies having any symptoms low risks continue present management which include beta-blockade Ventricular ectopy denies have any much palpitations she is fine overall Dyslipidemia, she is on moderate intensity statin I did review K PN which show me her LDL 81 HDL 88 this is from April 2023 Palpitations stable   Medication Adjustments/Labs and Tests Ordered: Current medicines are reviewed at length with the patient today.  Concerns regarding medicines are outlined above.  No orders of the defined types were placed in this encounter.  Medication changes: No orders of the defined types were placed in this encounter.   Signed, Park Liter, MD, [facility] 06/29/2022 9:23 AM    [place]

## 2022-07-06 DIAGNOSIS — H5213 Myopia, bilateral: Secondary | ICD-10-CM | POA: Diagnosis not present

## 2022-07-06 DIAGNOSIS — Z0100 Encounter for examination of eyes and vision without abnormal findings: Secondary | ICD-10-CM | POA: Diagnosis not present

## 2022-08-04 DIAGNOSIS — E785 Hyperlipidemia, unspecified: Secondary | ICD-10-CM | POA: Diagnosis not present

## 2022-08-04 DIAGNOSIS — R7301 Impaired fasting glucose: Secondary | ICD-10-CM | POA: Diagnosis not present

## 2022-08-04 DIAGNOSIS — E039 Hypothyroidism, unspecified: Secondary | ICD-10-CM | POA: Diagnosis not present

## 2022-08-04 DIAGNOSIS — I4729 Other ventricular tachycardia: Secondary | ICD-10-CM | POA: Diagnosis not present

## 2022-08-09 DIAGNOSIS — D225 Melanocytic nevi of trunk: Secondary | ICD-10-CM | POA: Diagnosis not present

## 2022-08-09 DIAGNOSIS — L821 Other seborrheic keratosis: Secondary | ICD-10-CM | POA: Diagnosis not present

## 2022-08-09 DIAGNOSIS — L650 Telogen effluvium: Secondary | ICD-10-CM | POA: Diagnosis not present

## 2022-08-09 DIAGNOSIS — D2239 Melanocytic nevi of other parts of face: Secondary | ICD-10-CM | POA: Diagnosis not present

## 2022-10-10 DIAGNOSIS — H6691 Otitis media, unspecified, right ear: Secondary | ICD-10-CM | POA: Diagnosis not present

## 2022-11-05 DIAGNOSIS — I499 Cardiac arrhythmia, unspecified: Secondary | ICD-10-CM | POA: Diagnosis not present

## 2022-11-05 DIAGNOSIS — Z8249 Family history of ischemic heart disease and other diseases of the circulatory system: Secondary | ICD-10-CM | POA: Diagnosis not present

## 2022-11-05 DIAGNOSIS — I10 Essential (primary) hypertension: Secondary | ICD-10-CM | POA: Diagnosis not present

## 2022-11-05 DIAGNOSIS — E669 Obesity, unspecified: Secondary | ICD-10-CM | POA: Diagnosis not present

## 2022-11-05 DIAGNOSIS — Z85828 Personal history of other malignant neoplasm of skin: Secondary | ICD-10-CM | POA: Diagnosis not present

## 2022-11-05 DIAGNOSIS — Z823 Family history of stroke: Secondary | ICD-10-CM | POA: Diagnosis not present

## 2022-11-05 DIAGNOSIS — Z833 Family history of diabetes mellitus: Secondary | ICD-10-CM | POA: Diagnosis not present

## 2022-11-05 DIAGNOSIS — E785 Hyperlipidemia, unspecified: Secondary | ICD-10-CM | POA: Diagnosis not present

## 2022-11-05 DIAGNOSIS — Z809 Family history of malignant neoplasm, unspecified: Secondary | ICD-10-CM | POA: Diagnosis not present

## 2022-11-05 DIAGNOSIS — I700 Atherosclerosis of aorta: Secondary | ICD-10-CM | POA: Diagnosis not present

## 2022-11-05 DIAGNOSIS — B259 Cytomegaloviral disease, unspecified: Secondary | ICD-10-CM | POA: Diagnosis not present

## 2022-11-05 DIAGNOSIS — Z6833 Body mass index (BMI) 33.0-33.9, adult: Secondary | ICD-10-CM | POA: Diagnosis not present

## 2023-01-13 ENCOUNTER — Telehealth: Payer: Self-pay | Admitting: Cardiology

## 2023-01-13 NOTE — Telephone Encounter (Signed)
Patient is calling because she was told to call and schedule an Echo for September. There is currently not an active request for the Echo. Please advise

## 2023-02-03 DIAGNOSIS — Z1231 Encounter for screening mammogram for malignant neoplasm of breast: Secondary | ICD-10-CM | POA: Diagnosis not present

## 2023-02-03 DIAGNOSIS — R7301 Impaired fasting glucose: Secondary | ICD-10-CM | POA: Diagnosis not present

## 2023-02-03 DIAGNOSIS — I4729 Other ventricular tachycardia: Secondary | ICD-10-CM | POA: Diagnosis not present

## 2023-02-03 DIAGNOSIS — E785 Hyperlipidemia, unspecified: Secondary | ICD-10-CM | POA: Diagnosis not present

## 2023-02-03 DIAGNOSIS — J9801 Acute bronchospasm: Secondary | ICD-10-CM | POA: Diagnosis not present

## 2023-02-03 DIAGNOSIS — E039 Hypothyroidism, unspecified: Secondary | ICD-10-CM | POA: Diagnosis not present

## 2023-02-04 LAB — LAB REPORT - SCANNED
A1c: 5.7
EGFR: 79

## 2023-02-17 DIAGNOSIS — L661 Lichen planopilaris: Secondary | ICD-10-CM | POA: Diagnosis not present

## 2023-03-08 ENCOUNTER — Other Ambulatory Visit: Payer: Self-pay | Admitting: Cardiology

## 2023-05-03 DIAGNOSIS — Z1231 Encounter for screening mammogram for malignant neoplasm of breast: Secondary | ICD-10-CM | POA: Diagnosis not present

## 2023-05-29 DIAGNOSIS — L661 Lichen planopilaris: Secondary | ICD-10-CM | POA: Diagnosis not present

## 2023-06-21 ENCOUNTER — Other Ambulatory Visit: Payer: Self-pay | Admitting: Internal Medicine

## 2023-06-21 ENCOUNTER — Ambulatory Visit: Payer: Medicare HMO

## 2023-06-21 DIAGNOSIS — I471 Supraventricular tachycardia, unspecified: Secondary | ICD-10-CM | POA: Diagnosis not present

## 2023-06-21 DIAGNOSIS — I081 Rheumatic disorders of both mitral and tricuspid valves: Secondary | ICD-10-CM | POA: Diagnosis not present

## 2023-06-21 DIAGNOSIS — I5030 Unspecified diastolic (congestive) heart failure: Secondary | ICD-10-CM

## 2023-06-21 LAB — ECHOCARDIOGRAM COMPLETE
Area-P 1/2: 3.85 cm2
MV M vel: 5.62 m/s
MV Peak grad: 126.3 mmHg
Radius: 0.45 cm
S' Lateral: 3.65 cm

## 2023-06-28 ENCOUNTER — Encounter: Payer: Self-pay | Admitting: Cardiology

## 2023-06-28 ENCOUNTER — Ambulatory Visit: Payer: Medicare HMO | Attending: Cardiology | Admitting: Cardiology

## 2023-06-28 VITALS — BP 126/78 | HR 77 | Ht 64.0 in | Wt 205.4 lb

## 2023-06-28 DIAGNOSIS — R002 Palpitations: Secondary | ICD-10-CM

## 2023-06-28 DIAGNOSIS — I493 Ventricular premature depolarization: Secondary | ICD-10-CM | POA: Diagnosis not present

## 2023-06-28 DIAGNOSIS — E782 Mixed hyperlipidemia: Secondary | ICD-10-CM | POA: Diagnosis not present

## 2023-06-28 DIAGNOSIS — I4729 Other ventricular tachycardia: Secondary | ICD-10-CM

## 2023-06-28 NOTE — Patient Instructions (Signed)

## 2023-06-28 NOTE — Progress Notes (Signed)
Cardiology Office Note:    Date:  06/28/2023   ID:  Beth Soto, DOB Aug 01, 1956, MRN [id number]  PCP:  Paulina Fusi, MD  Cardiologist:  Gypsy Balsam, MD    Referring MD: Paulina Fusi, MD   Chief Complaint  Patient presents with   Follow-up    History of Present Illness:    Beth Soto is a 67 y.o. female with past medical history significant for nonsustained ventricular tachycardia, PVCs.  Initial referral was for palpitations but monitor showed PVCs and nonsustained ventricular tachycardia.  Stratification of this arrhythmia occluded echocardiogram as well as investigation for coronary artery disease she did have coronary CT angio which showed calcium score 0 and no coronary artery disease.  Additional problem includes hyperlipidemia she is on statin, Raynaud's phenomena history of melanoma. Comes today to months for follow-up.  Overall doing very well.  She denies have any chest pain tightness squeezing pressure burning chest.  She walks with her dog cc every single day and enjoyed doing this.  Past Medical History:  Diagnosis Date   Abscess of skin of right shoulder 10/01/2020   Arthritis    Cancer (HCC)    melanoma   Cervical radiculopathy 08/19/2021   Gastric ulcer    Hyperlipidemia    Malignant melanoma (HCC)    Raynaud's disease without gangrene    Shoulder pain 08/19/2021   Thyroid disease    Tuberculosis    67 years old; on INS for 1 year    Past Surgical History:  Procedure Laterality Date   COLONOSCOPY     HAMMER TOE SURGERY     MELANOMA EXCISION     TOTAL HIP ARTHROPLASTY Right 04/2020    Current Medications: Current Meds  Medication Sig   acyclovir (ZOVIRAX) 400 MG tablet Take 400 mg by mouth as needed (cold sores).   atorvastatin (LIPITOR) 20 MG tablet Take 20 mg by mouth at bedtime.   Calcium Carb-Cholecalciferol (CALCIUM 600 + D PO) Take 1 tablet by mouth daily. Unknown strength   Cholecalciferol (VITAMIN D3 PO) Take 2,000 Units by  mouth daily.   levothyroxine (SYNTHROID) 150 MCG tablet Take 150 mcg by mouth daily before breakfast.   metoprolol succinate (TOPROL-XL) 50 MG 24 hr tablet TAKE 1 AND 1/2 TABLETS DAILY (75 MG TOTAL) BY MOUTH WITH FOOD OR IMMEDIATELY AFTER MEALS (Patient taking differently: Take 75 mg by mouth daily.)   minoxidil (LONITEN) 2.5 MG tablet Take 2.5 mg by mouth daily.   Current Facility-Administered Medications for the 06/28/23 encounter (Office Visit) with Georgeanna Lea, MD  Medication   0.9 %  sodium chloride infusion     Allergies:   Patient has no known allergies.   Social History   Socioeconomic History   Marital status: Widowed    Spouse name: Not on file   Number of children: 1   Years of education: Not on file   Highest education level: Not on file  Occupational History   Occupation: retired Forensic scientist  Tobacco Use   Smoking status: Never   Smokeless tobacco: Never  Vaping Use   Vaping status: Never Used  Substance and Sexual Activity   Alcohol use: Yes    Comment: occassioally   Drug use: No   Sexual activity: Not on file  Other Topics Concern   Not on file  Social History Narrative   Not on file   Social Determinants of Health   Financial Resource Strain: Not on file  Food Insecurity: Not  on file  Transportation Needs: Not on file  Physical Activity: Not on file  Stress: Not on file  Social Connections: Not on file     Family History: The patient's family history includes Atrial fibrillation in her father; Bladder Cancer in her brother; COPD in her brother; Colon polyps in her mother; Diabetes in her brother; Heart disease in her father and mother; Heart failure in her brother, father, and mother; Hepatitis C in her brother; Hypertension in her mother; Stroke in her father and mother. There is no history of Colon cancer, Esophageal cancer, Rectal cancer, or Stomach cancer. ROS:   Please see the history of present illness.    All 14 point review of  systems negative except as described per history of present illness  EKGs/Labs/Other Studies Reviewed:         Recent Labs: No results found for requested labs within last 365 days.  Recent Lipid Panel No results found for: "CHOL", "TRIG", "HDL", "CHOLHDL", "VLDL", "LDLCALC", "LDLDIRECT"  Physical Exam:    VS:  BP 126/78 (BP Location: Left Arm, Patient Position: Sitting)   Pulse 77   Ht 5\' 4"  (1.626 m)   Wt 205 lb 6.4 oz (93.2 kg)   SpO2 95%   BMI 35.26 kg/m     Wt Readings from Last 3 Encounters:  06/28/23 205 lb 6.4 oz (93.2 kg)  06/29/22 203 lb 6.4 oz (92.3 kg)  12/22/21 199 lb 6.4 oz (90.4 kg)     GEN:  Well nourished, well developed in no acute distress HEENT: Normal NECK: No JVD; No carotid bruits LYMPHATICS: No lymphadenopathy CARDIAC: RRR, no murmurs, no rubs, no gallops RESPIRATORY:  Clear to auscultation without rales, wheezing or rhonchi  ABDOMEN: Soft, non-tender, non-distended MUSCULOSKELETAL:  No edema; No deformity  SKIN: Warm and dry LOWER EXTREMITIES: no swelling NEUROLOGIC:  Alert and oriented x 3 PSYCHIATRIC:  Normal affect   ASSESSMENT:    1. Palpitations   2. Nonsustained ventricular tachycardia (HCC)   3. Ventricular ectopy   4. Mixed hyperlipidemia    PLAN:    In order of problems listed above:  Palpitations history of nonsustained ventricular tachycardia no dizziness no passing out no symptoms except very rare palpitations.  This being managed beta-blocker.  Risk stratification show low risk. Mixed dyslipidemia I did review K PN which show me her LDL of 63 HDL 58.  She is on Lipitor 20 which I will continue. Nonsustained ventricular tachycardia no dizziness no passing out   Medication Adjustments/Labs and Tests Ordered: Current medicines are reviewed at length with the patient today.  Concerns regarding medicines are outlined above.  Orders Placed This Encounter  Procedures   EKG 12-Lead   Medication changes: No orders of the  defined types were placed in this encounter.   Signed, Georgeanna Lea, MD, [facility] 06/28/2023 9:13 AM    [facility]

## 2023-07-19 DIAGNOSIS — Z0000 Encounter for general adult medical examination without abnormal findings: Secondary | ICD-10-CM | POA: Diagnosis not present

## 2023-07-19 DIAGNOSIS — Z9181 History of falling: Secondary | ICD-10-CM | POA: Diagnosis not present

## 2023-07-20 IMAGING — CT CT HEART MORP W/ CTA COR W/ SCORE W/ CA W/CM &/OR W/O CM
4 of 7 series · 8 of 20 positions shown, 9 images · IV contrast (APPLIED)
Comparison: None.
COMPARISON: None.
COMPARISON: None.
COMPARISON: None.

Addendum:
EXAM:
OVER-READ INTERPRETATION  CT CHEST

The following report is an over-read performed by radiologist Dr.
Hard Tiger [REDACTED] on 10/12/2021. This over-read
does not include interpretation of cardiac or coronary anatomy or
pathology. The coronary CTA interpretation by the cardiologist is
attached.
CLINICAL DATA: CP
Cardiac/Coronary  CTA
TECHNIQUE: The patient was scanned on a [employer] scanner.

[Series 6: best diast · axial · 0.39mm/px · z∈[+1162,+1199]mm · 2 of 284 slices shown, 3 images]
[im 95/284  vessel]
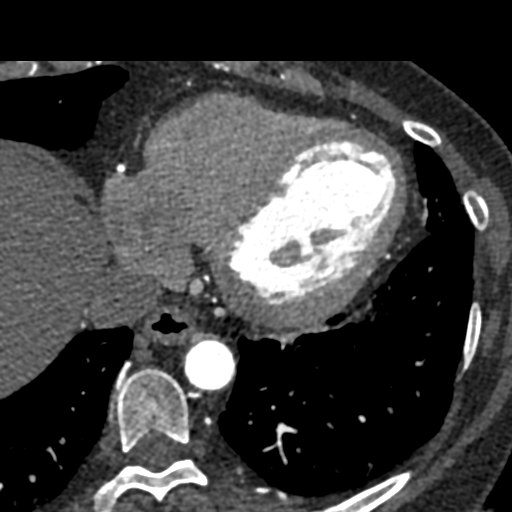
[im 95/284  lung]
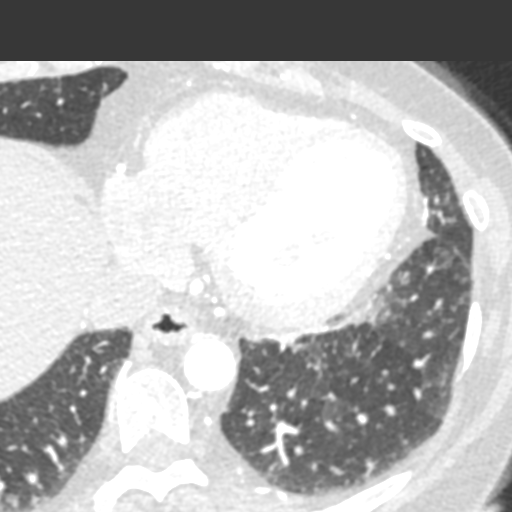
[im 189/284  vessel]
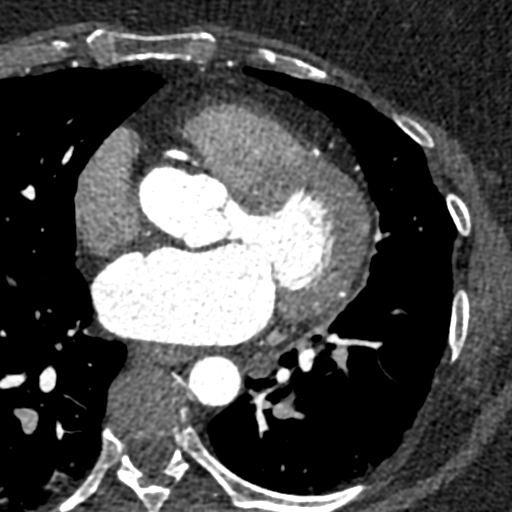

[Series 7: best syst · axial · 0.39mm/px · z∈[+1162,+1199]mm · 2 of 284 slices shown]
[im 95/284  vessel]
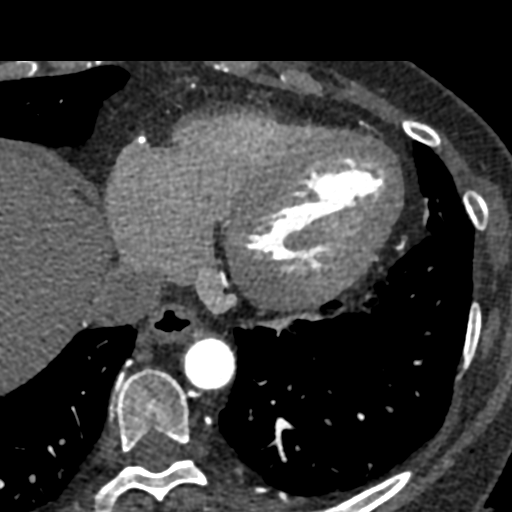
[im 189/284  vessel]
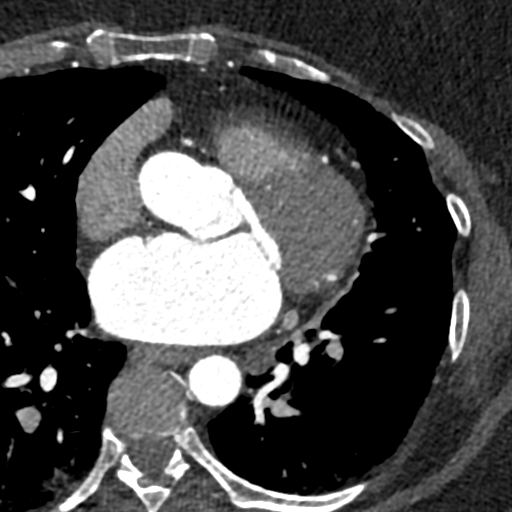

[Series 8: ts diast · axial · 0.39mm/px · z∈[+1162,+1199]mm · 2 of 284 slices shown]
[im 95/284  vessel]
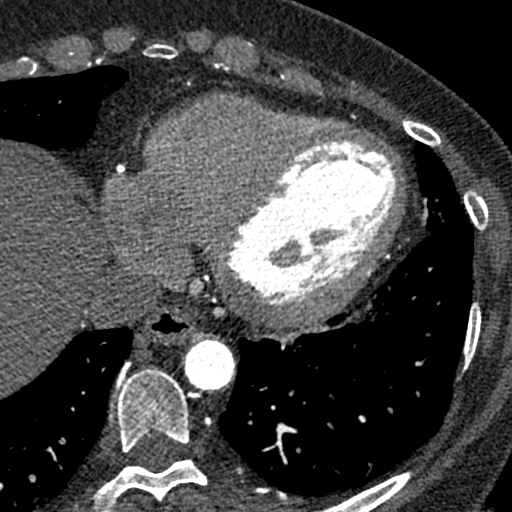
[im 189/284  vessel]
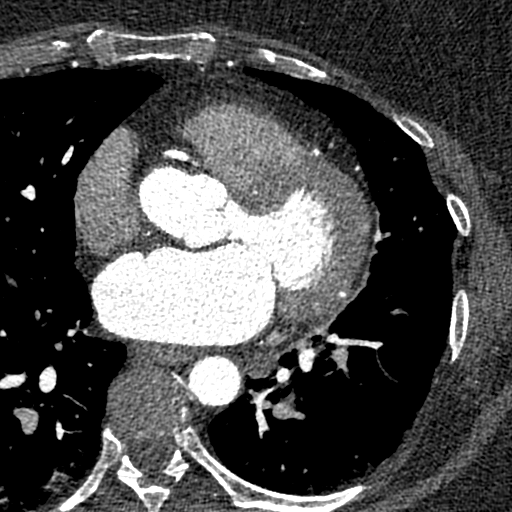

[Series 9: ts syst · axial · 0.39mm/px · z∈[+1162,+1199]mm · 2 of 284 slices shown]
[im 95/284  vessel]
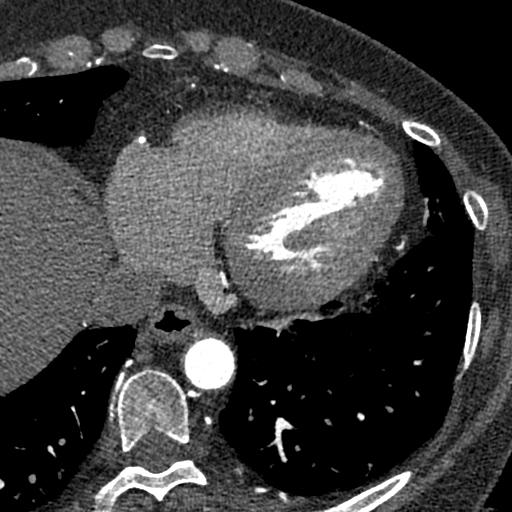
[im 189/284  vessel]
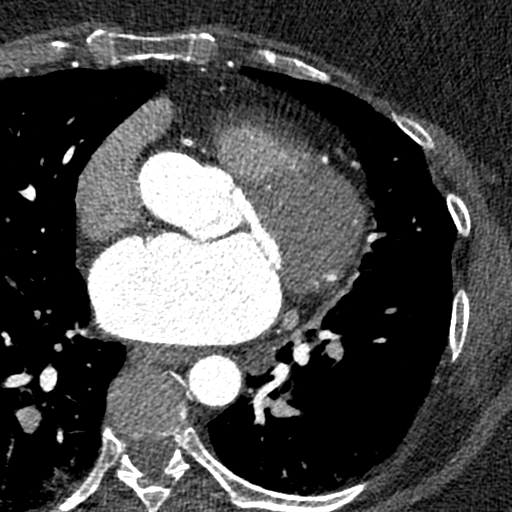

[8 of 20 positions shown; findings below may reference images not displayed]

FINDINGS: Vascular: Heart is normal size.  Aorta normal caliber.

Mediastinum/Nodes: No adenopathy

Lungs/Pleura: No confluent opacities or effusions.

Upper Abdomen: Imaging into the upper abdomen demonstrates no acute
findings.

Musculoskeletal: Chest wall soft tissues are unremarkable. No acute
bony abnormality.
IMPRESSION: No acute or significant extracardiac abnormality.
FINDINGS: A 110 kV prospective scan was triggered in the descending thoracic
aorta at 111 HU's. Axial non-contrast 3 mm slices were carried out
through the heart. The data set was analyzed on a dedicated work
station and scored using the Agatson method. Gantry rotation speed
was 250 msecs and collimation was .6 mm. No beta blockade and 0.8 mg
of sl NTG was given. The 3D data set was reconstructed in 5%
intervals of the 67-82 % of the R-R cycle. Diastolic phases were
analyzed on a dedicated work station using MPR, MIP and VRT modes.
The patient received 80 cc of contrast.

Aorta:  Normal size.  No calcifications.  No dissection.

Aortic Valve:  Trileaflet.  No calcifications.

Coronary Arteries:  Normal coronary origin.  Right dominance.

RCA is a large dominant artery that gives rise to PDA and PLA. There
is no plaque.

Left main is a large artery that gives rise to LAD and LCX arteries.

LAD is a large vessel that has no plaque. This artery gives rise to
moderate size D1 and D2.

LCX is a non-dominant artery that gives rise to one very large OM1
branch. There is no plaque.

Other findings:

Normal pulmonary vein drainage into the left atrium.

Normal left atrial appendage without a thrombus.

Normal size of the pulmonary artery.
IMPRESSION: 1. Coronary calcium score of 0. This was 0 percentile for age and
sex matched control.

2. Normal coronary origin with right dominance.

3. CAD-RADS 0. No evidence of CAD (0%). Consider non-atherosclerotic
causes of chest pain.
FINDINGS: A 120 kV prospective scan was triggered in the descending thoracic
aorta at 111 HU's. Axial non-contrast 3 mm slices were carried out
through the heart. The data set was analyzed on a dedicated work
station and scored using the Agatson method. Gantry rotation speed
was 250 msecs and collimation was .6 mm. No beta blockade and 0.8 mg
of sl NTG was given. The 3D data set was reconstructed in 5%
intervals of the 67-82 % of the R-R cycle. Diastolic phases were
analyzed on a dedicated work station using MPR, MIP and VRT modes.
The patient received 80 cc of contrast.

Aorta:  Normal size.  No calcifications.  No dissection.

Aortic Valve:  Trileaflet.  No calcifications.

Coronary Arteries:  Normal coronary origin.  Right dominance.

RCA is a large dominant artery that gives rise to PDA and PLA. There
is no plaque.

Left main is a large artery that gives rise to LAD and LCX arteries
as well as moderate size intermediate branch. Distal portion of LM
has moderate size calcified plaque that extends into proximal
portion of LAD. Mild stenosis of 25-49% noted.

LAD is a large vessel that has no plaque. This artery has plaque in
is proximal portion as described above. Moderate size D1 branch is
noted.

LCX is a non-dominant artery that gives rise to one large OM1
branch. There is no plaque.

Moderate size intermediate branch is free of disease.

Other findings:

Normal pulmonary vein drainage into the left atrium. Short left
common trunk noted - normal variant.

Normal left atrial appendage without a thrombus.

Normal size of the pulmonary artery.
IMPRESSION: 1. Coronary calcium score of 94.3. This was 75 percentile for age
and sex matched control.

2. Normal coronary origin with right dominance.

3. CAD-RADS 2. Mild non-obstructive CAD (25-49%) LM, LAD. Consider
non-atherosclerotic causes of chest pain. Consider preventive
therapy and risk factor modification

Since lesion in distal LM and proximal LAD can carry significant
risks, FFR will be performed to assess hemodynamic significance of
that lesion.

ADDENDUM:
Please disregard the second portion of this report.

CORRECT IMPRESSION:
CORRECT IMPRESSION
1. Coronary calcium score of 0. This was 0 percentile for age and
sex matched control.

2. Normal coronary origin with right dominance.

3. CAD-RADS 0. No evidence of CAD (0%). Consider non-atherosclerotic
causes of chest pain.

*** End of Addendum ***
Addendum:
EXAM:
OVER-READ INTERPRETATION  CT CHEST

The following report is an over-read performed by radiologist Dr.
Hard Tiger [REDACTED] on 10/12/2021. This over-read
does not include interpretation of cardiac or coronary anatomy or
pathology. The coronary CTA interpretation by the cardiologist is
attached.
FINDINGS: Vascular: Heart is normal size.  Aorta normal caliber.

Mediastinum/Nodes: No adenopathy

Lungs/Pleura: No confluent opacities or effusions.

Upper Abdomen: Imaging into the upper abdomen demonstrates no acute
findings.

Musculoskeletal: Chest wall soft tissues are unremarkable. No acute
bony abnormality.
IMPRESSION: No acute or significant extracardiac abnormality.
FINDINGS: A 110 kV prospective scan was triggered in the descending thoracic
aorta at 111 HU's. Axial non-contrast 3 mm slices were carried out
through the heart. The data set was analyzed on a dedicated work
station and scored using the Agatson method. Gantry rotation speed
was 250 msecs and collimation was .6 mm. No beta blockade and 0.8 mg
of sl NTG was given. The 3D data set was reconstructed in 5%
intervals of the 67-82 % of the R-R cycle. Diastolic phases were
analyzed on a dedicated work station using MPR, MIP and VRT modes.
The patient received 80 cc of contrast.

Aorta:  Normal size.  No calcifications.  No dissection.

Aortic Valve:  Trileaflet.  No calcifications.

Coronary Arteries:  Normal coronary origin.  Right dominance.

RCA is a large dominant artery that gives rise to PDA and PLA. There
is no plaque.

Left main is a large artery that gives rise to LAD and LCX arteries.

LAD is a large vessel that has no plaque. This artery gives rise to
moderate size D1 and D2.

LCX is a non-dominant artery that gives rise to one very large OM1
branch. There is no plaque.

Other findings:

Normal pulmonary vein drainage into the left atrium.

Normal left atrial appendage without a thrombus.

Normal size of the pulmonary artery.
IMPRESSION: 1. Coronary calcium score of 0. This was 0 percentile for age and
sex matched control.

2. Normal coronary origin with right dominance.

3. CAD-RADS 0. No evidence of CAD (0%). Consider non-atherosclerotic
causes of chest pain.
FINDINGS: A 120 kV prospective scan was triggered in the descending thoracic
aorta at 111 HU's. Axial non-contrast 3 mm slices were carried out
through the heart. The data set was analyzed on a dedicated work
station and scored using the Agatson method. Gantry rotation speed
was 250 msecs and collimation was .6 mm. No beta blockade and 0.8 mg
of sl NTG was given. The 3D data set was reconstructed in 5%
intervals of the 67-82 % of the R-R cycle. Diastolic phases were
analyzed on a dedicated work station using MPR, MIP and VRT modes.
The patient received 80 cc of contrast.

Aorta:  Normal size.  No calcifications.  No dissection.

Aortic Valve:  Trileaflet.  No calcifications.

Coronary Arteries:  Normal coronary origin.  Right dominance.

RCA is a large dominant artery that gives rise to PDA and PLA. There
is no plaque.

Left main is a large artery that gives rise to LAD and LCX arteries
as well as moderate size intermediate branch. Distal portion of LM
has moderate size calcified plaque that extends into proximal
portion of LAD. Mild stenosis of 25-49% noted.

LAD is a large vessel that has no plaque. This artery has plaque in
is proximal portion as described above. Moderate size D1 branch is
noted.

LCX is a non-dominant artery that gives rise to one large OM1
branch. There is no plaque.

Moderate size intermediate branch is free of disease.

Other findings:

Normal pulmonary vein drainage into the left atrium. Short left
common trunk noted - normal variant.

Normal left atrial appendage without a thrombus.

Normal size of the pulmonary artery.
IMPRESSION: 1. Coronary calcium score of 94.3. This was 75 percentile for age
and sex matched control.

2. Normal coronary origin with right dominance.

3. CAD-RADS 2. Mild non-obstructive CAD (25-49%) LM, LAD. Consider
non-atherosclerotic causes of chest pain. Consider preventive
therapy and risk factor modification

Since lesion in distal LM and proximal LAD can carry significant
risks, FFR will be performed to assess hemodynamic significance of
that lesion.

*** End of Addendum ***
Addendum:
EXAM:
OVER-READ INTERPRETATION  CT CHEST

The following report is an over-read performed by radiologist Dr.
Hard Tiger [REDACTED] on 10/12/2021. This over-read
does not include interpretation of cardiac or coronary anatomy or
pathology. The coronary CTA interpretation by the cardiologist is
attached.
FINDINGS: Vascular: Heart is normal size.  Aorta normal caliber.

Mediastinum/Nodes: No adenopathy

Lungs/Pleura: No confluent opacities or effusions.

Upper Abdomen: Imaging into the upper abdomen demonstrates no acute
findings.

Musculoskeletal: Chest wall soft tissues are unremarkable. No acute
bony abnormality.
IMPRESSION: No acute or significant extracardiac abnormality.
FINDINGS: A 110 kV prospective scan was triggered in the descending thoracic
aorta at 111 HU's. Axial non-contrast 3 mm slices were carried out
through the heart. The data set was analyzed on a dedicated work
station and scored using the Agatson method. Gantry rotation speed
was 250 msecs and collimation was .6 mm. No beta blockade and 0.8 mg
of sl NTG was given. The 3D data set was reconstructed in 5%
intervals of the 67-82 % of the R-R cycle. Diastolic phases were
analyzed on a dedicated work station using MPR, MIP and VRT modes.
The patient received 80 cc of contrast.

Aorta:  Normal size.  No calcifications.  No dissection.

Aortic Valve:  Trileaflet.  No calcifications.

Coronary Arteries:  Normal coronary origin.  Right dominance.

RCA is a large dominant artery that gives rise to PDA and PLA. There
is no plaque.

Left main is a large artery that gives rise to LAD and LCX arteries.

LAD is a large vessel that has no plaque. This artery gives rise to
moderate size D1 and D2.

LCX is a non-dominant artery that gives rise to one very large OM1
branch. There is no plaque.

Other findings:

Normal pulmonary vein drainage into the left atrium.

Normal left atrial appendage without a thrombus.

Normal size of the pulmonary artery.
IMPRESSION: 1. Coronary calcium score of 0. This was 0 percentile for age and
sex matched control.

2. Normal coronary origin with right dominance.

3. CAD-RADS 0. No evidence of CAD (0%). Consider non-atherosclerotic
causes of chest pain.

*** End of Addendum ***
EXAM:
OVER-READ INTERPRETATION  CT CHEST

The following report is an over-read performed by radiologist Dr.
Hard Tiger [REDACTED] on 10/12/2021. This over-read
does not include interpretation of cardiac or coronary anatomy or
pathology. The coronary CTA interpretation by the cardiologist is
attached.
FINDINGS: Vascular: Heart is normal size.  Aorta normal caliber.

Mediastinum/Nodes: No adenopathy

Lungs/Pleura: No confluent opacities or effusions.

Upper Abdomen: Imaging into the upper abdomen demonstrates no acute
findings.

Musculoskeletal: Chest wall soft tissues are unremarkable. No acute
bony abnormality.
IMPRESSION: No acute or significant extracardiac abnormality.

## 2023-07-27 ENCOUNTER — Other Ambulatory Visit: Payer: Self-pay | Admitting: Cardiology

## 2023-08-04 DIAGNOSIS — E785 Hyperlipidemia, unspecified: Secondary | ICD-10-CM | POA: Diagnosis not present

## 2023-08-04 DIAGNOSIS — E039 Hypothyroidism, unspecified: Secondary | ICD-10-CM | POA: Diagnosis not present

## 2023-08-04 DIAGNOSIS — M8589 Other specified disorders of bone density and structure, multiple sites: Secondary | ICD-10-CM | POA: Diagnosis not present

## 2023-08-04 DIAGNOSIS — I4729 Other ventricular tachycardia: Secondary | ICD-10-CM | POA: Diagnosis not present

## 2023-08-04 DIAGNOSIS — R7301 Impaired fasting glucose: Secondary | ICD-10-CM | POA: Diagnosis not present

## 2023-08-14 DIAGNOSIS — D2239 Melanocytic nevi of other parts of face: Secondary | ICD-10-CM | POA: Diagnosis not present

## 2023-08-14 DIAGNOSIS — D225 Melanocytic nevi of trunk: Secondary | ICD-10-CM | POA: Diagnosis not present

## 2023-08-14 DIAGNOSIS — L821 Other seborrheic keratosis: Secondary | ICD-10-CM | POA: Diagnosis not present

## 2023-08-14 DIAGNOSIS — D485 Neoplasm of uncertain behavior of skin: Secondary | ICD-10-CM | POA: Diagnosis not present

## 2023-08-14 DIAGNOSIS — L814 Other melanin hyperpigmentation: Secondary | ICD-10-CM | POA: Diagnosis not present

## 2023-08-30 ENCOUNTER — Other Ambulatory Visit: Payer: Self-pay | Admitting: Medical Genetics

## 2023-08-30 DIAGNOSIS — Z006 Encounter for examination for normal comparison and control in clinical research program: Secondary | ICD-10-CM

## 2023-08-31 DIAGNOSIS — H524 Presbyopia: Secondary | ICD-10-CM | POA: Diagnosis not present

## 2023-09-19 DIAGNOSIS — M8589 Other specified disorders of bone density and structure, multiple sites: Secondary | ICD-10-CM | POA: Diagnosis not present

## 2023-09-19 DIAGNOSIS — M8580 Other specified disorders of bone density and structure, unspecified site: Secondary | ICD-10-CM | POA: Diagnosis not present

## 2023-09-28 ENCOUNTER — Other Ambulatory Visit (HOSPITAL_COMMUNITY)
Admission: RE | Admit: 2023-09-28 | Discharge: 2023-09-28 | Disposition: A | Discharge status: Home / Self Care | Payer: Medicare HMO | Source: Ambulatory Visit | Attending: Medical Genetics | Admitting: Medical Genetics

## 2023-09-28 DIAGNOSIS — Z006 Encounter for examination for normal comparison and control in clinical research program: Secondary | ICD-10-CM | POA: Insufficient documentation

## 2023-10-09 LAB — GENECONNECT MOLECULAR SCREEN: Genetic Analysis Overall Interpretation: NEGATIVE

## 2023-10-12 DIAGNOSIS — Z01419 Encounter for gynecological examination (general) (routine) without abnormal findings: Secondary | ICD-10-CM | POA: Diagnosis not present

## 2023-11-19 DIAGNOSIS — I700 Atherosclerosis of aorta: Secondary | ICD-10-CM | POA: Diagnosis not present

## 2023-11-19 DIAGNOSIS — Z823 Family history of stroke: Secondary | ICD-10-CM | POA: Diagnosis not present

## 2023-11-19 DIAGNOSIS — I2510 Atherosclerotic heart disease of native coronary artery without angina pectoris: Secondary | ICD-10-CM | POA: Diagnosis not present

## 2023-11-19 DIAGNOSIS — I129 Hypertensive chronic kidney disease with stage 1 through stage 4 chronic kidney disease, or unspecified chronic kidney disease: Secondary | ICD-10-CM | POA: Diagnosis not present

## 2023-11-19 DIAGNOSIS — N182 Chronic kidney disease, stage 2 (mild): Secondary | ICD-10-CM | POA: Diagnosis not present

## 2023-11-19 DIAGNOSIS — Z833 Family history of diabetes mellitus: Secondary | ICD-10-CM | POA: Diagnosis not present

## 2023-11-19 DIAGNOSIS — E039 Hypothyroidism, unspecified: Secondary | ICD-10-CM | POA: Diagnosis not present

## 2023-11-19 DIAGNOSIS — M1990 Unspecified osteoarthritis, unspecified site: Secondary | ICD-10-CM | POA: Diagnosis not present

## 2023-11-19 DIAGNOSIS — E785 Hyperlipidemia, unspecified: Secondary | ICD-10-CM | POA: Diagnosis not present

## 2023-11-19 DIAGNOSIS — E669 Obesity, unspecified: Secondary | ICD-10-CM | POA: Diagnosis not present

## 2023-11-19 DIAGNOSIS — Z008 Encounter for other general examination: Secondary | ICD-10-CM | POA: Diagnosis not present

## 2023-11-19 DIAGNOSIS — Z809 Family history of malignant neoplasm, unspecified: Secondary | ICD-10-CM | POA: Diagnosis not present

## 2023-11-19 DIAGNOSIS — Z8249 Family history of ischemic heart disease and other diseases of the circulatory system: Secondary | ICD-10-CM | POA: Diagnosis not present

## 2023-11-20 DIAGNOSIS — M85852 Other specified disorders of bone density and structure, left thigh: Secondary | ICD-10-CM | POA: Diagnosis not present

## 2023-12-19 ENCOUNTER — Telehealth: Payer: Self-pay

## 2023-12-19 DIAGNOSIS — R0609 Other forms of dyspnea: Secondary | ICD-10-CM

## 2023-12-19 NOTE — Telephone Encounter (Signed)
 Echo ordered for September appt. Per Dr. Bing Matter.

## 2024-01-30 DIAGNOSIS — E785 Hyperlipidemia, unspecified: Secondary | ICD-10-CM | POA: Diagnosis not present

## 2024-01-30 DIAGNOSIS — I4891 Unspecified atrial fibrillation: Secondary | ICD-10-CM | POA: Diagnosis not present

## 2024-01-30 DIAGNOSIS — R7301 Impaired fasting glucose: Secondary | ICD-10-CM | POA: Diagnosis not present

## 2024-01-30 DIAGNOSIS — E039 Hypothyroidism, unspecified: Secondary | ICD-10-CM | POA: Diagnosis not present

## 2024-01-30 DIAGNOSIS — I4729 Other ventricular tachycardia: Secondary | ICD-10-CM | POA: Diagnosis not present

## 2024-02-01 ENCOUNTER — Encounter: Payer: Self-pay | Admitting: Cardiology

## 2024-02-01 ENCOUNTER — Ambulatory Visit: Attending: Cardiology | Admitting: Cardiology

## 2024-02-01 VITALS — BP 116/70 | HR 114 | Ht 64.0 in | Wt 211.6 lb

## 2024-02-01 DIAGNOSIS — I4729 Other ventricular tachycardia: Secondary | ICD-10-CM | POA: Diagnosis not present

## 2024-02-01 DIAGNOSIS — I480 Paroxysmal atrial fibrillation: Secondary | ICD-10-CM

## 2024-02-01 DIAGNOSIS — E782 Mixed hyperlipidemia: Secondary | ICD-10-CM | POA: Diagnosis not present

## 2024-02-01 DIAGNOSIS — I493 Ventricular premature depolarization: Secondary | ICD-10-CM | POA: Diagnosis not present

## 2024-02-01 DIAGNOSIS — R0609 Other forms of dyspnea: Secondary | ICD-10-CM | POA: Diagnosis not present

## 2024-02-01 HISTORY — DX: Paroxysmal atrial fibrillation: I48.0

## 2024-02-01 MED ORDER — DILTIAZEM HCL ER COATED BEADS 120 MG PO CP24: 120.0000 mg | ORAL_CAPSULE | Freq: Every day | ORAL | 3 refills | Status: AC

## 2024-02-01 NOTE — Addendum Note (Signed)
 Addended by: Shawnee Dellen D on: 02/01/2024 04:29 PM   Modules accepted: Orders

## 2024-02-01 NOTE — Patient Instructions (Signed)
 Medication Instructions:   START: Diltiazem  CD 120mg  1 tablet daily   Lab Work: None Ordered If you have labs (blood work) drawn today and your tests are completely normal, you will receive your results only by: MyChart Message (if you have MyChart) OR A paper copy in the mail If you have any lab test that is abnormal or we need to change your treatment, we will call you to review the results.   Testing/Procedures: Your physician has requested that you have an echocardiogram. Echocardiography is a painless test that uses sound waves to create images of your heart. It provides your doctor with information about the size and shape of your heart and how well your heart's chambers and valves are working. This procedure takes approximately one hour. There are no restrictions for this procedure. Please do NOT wear cologne, perfume, aftershave, or lotions (deodorant is allowed). Please arrive 15 minutes prior to your appointment time.  Please note: We ask at that you not bring children with you during ultrasound (echo/ vascular) testing. Due to room size and safety concerns, children are not allowed in the ultrasound rooms during exams. Our front office staff cannot provide observation of children in our lobby area while testing is being conducted. An adult accompanying a patient to their appointment will only be allowed in the ultrasound room at the discretion of the ultrasound technician under special circumstances. We apologize for any inconvenience.    Follow-Up: At [facility], you and your health needs are our priority.  As part of our continuing mission to provide you with exceptional heart care, we have created designated Provider Care Teams.  These Care Teams include your primary Cardiologist (physician) and Advanced Practice Providers (APPs -  Physician Assistants and Nurse Practitioners) who all work together to provide you with the care you need, when you need it.  We recommend  signing up for the patient portal called "MyChart".  Sign up information is provided on this After Visit Summary.  MyChart is used to connect with patients for Virtual Visits (Telemedicine).  Patients are able to view lab/test results, encounter notes, upcoming appointments, etc.  Non-urgent messages can be sent to your provider as well.   To learn more about what you can do with MyChart, go to [email or web address].    Your next appointment:   4 week(s)  The format for your next appointment:   In Person  Provider:   Ralene Burger, MD    Other Instructions NA

## 2024-02-01 NOTE — Progress Notes (Signed)
 Cardiology Office Note:    Date:  02/01/2024   ID:  TAUHEEDAH BOK, DOB Jan 28, 1956, MRN [id number]  PCP:  Adrian Hopper, MD  Cardiologist:  Ralene Burger, MD    Referring MD: Adrian Hopper, MD   Chief Complaint  Patient presents with   Atrial Fibrillation         History of Present Illness:    Beth Soto is a 68 y.o. female past medical history significant for nonsustained ventricular tachycardia PVCs stratification valve echocardiogram showed mild to moderate mitral regurgitation with mild enlargement of left atrium, coronary CT angio showed no coronary artery disease calcium score 0 additional problem include hyperlipidemia she is on statin, Raynaud phenomenon history of melanoma.  She went to her primary care physician 2 days ago because of palpitations she was found to be in atrial fibrillation.  Anticoagulation has been initiated beta-blocker has been increased 250 mg metoprolol  daily she is here in my office she still feel awful she gets short of breath quite easily but no chest pain tightness squeezing pressure burning chest  Past Medical History:  Diagnosis Date   Abscess of skin of right shoulder 10/01/2020   Arthritis    Cancer (HCC)    melanoma   Cervical radiculopathy 08/19/2021   Gastric ulcer    Hyperlipidemia    Malignant melanoma (HCC)    Raynaud's disease without gangrene    Shoulder pain 08/19/2021   Thyroid  disease    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; on INS for 1 year    Past Surgical History:  Procedure Laterality Date   COLONOSCOPY     HAMMER TOE SURGERY     MELANOMA EXCISION     TOTAL HIP ARTHROPLASTY Right 04/2020    Current Medications: Current Meds  Medication Sig   atorvastatin (LIPITOR) 20 MG tablet Take 20 mg by mouth at bedtime.   Calcium Carb-Cholecalciferol (CALCIUM 600 + D PO) Take 1 tablet by mouth daily. Unknown strength   Cholecalciferol (VITAMIN D3 PO) Take 2,000 Units by mouth daily.   clobetasol (TEMOVATE) 0.05 %  external solution Apply 1 Application topically 2 (two) times daily.   ELIQUIS 5 MG TABS tablet Take 5 mg by mouth 2 (two) times daily.   levothyroxine (SYNTHROID) 150 MCG tablet Take 150 mcg by mouth daily before breakfast.   metoprolol  succinate (TOPROL -XL) 50 MG 24 hr tablet Take 1.5 tablets (75 mg total) by mouth daily.   minoxidil (LONITEN) 2.5 MG tablet Take 2.5 mg by mouth daily.   tacrolimus (PROTOPIC) 0.1 % ointment Apply 1 Application topically 2 (two) times daily.   [DISCONTINUED] acyclovir (ZOVIRAX) 400 MG tablet Take 400 mg by mouth as needed (cold sores).   [DISCONTINUED] amoxicillin (AMOXIL) 875 MG tablet Take 875 mg by mouth 2 (two) times daily.   Current Facility-Administered Medications for the 02/01/24 encounter (Office Visit) with Mako Pelfrey J, MD  Medication   0.9 %  sodium chloride  infusion     Allergies:   Patient has no known allergies.   Social History   Socioeconomic History   Marital status: Widowed    Spouse name: Not on file   Number of children: 1   Years of education: Not on file   Highest education level: Not on file  Occupational History   Occupation: retired Forensic scientist  Tobacco Use   Smoking status: Never   Smokeless tobacco: Never  Vaping Use   Vaping status: Never Used  Substance and Sexual Activity  Alcohol use: Yes    Comment: occassioally   Drug use: No   Sexual activity: Not on file  Other Topics Concern   Not on file  Social History Narrative   Not on file   Social Drivers of Health   Financial Resource Strain: Not on file  Food Insecurity: Not on file  Transportation Needs: Not on file  Physical Activity: Not on file  Stress: Not on file  Social Connections: Not on file     Family History: The patient's family history includes Atrial fibrillation in her father; Bladder Cancer in her brother; COPD in her brother; Colon polyps in her mother; Diabetes in her brother; Heart disease in her father and mother;  Heart failure in her brother, father, and mother; Hepatitis C in her brother; Hypertension in her mother; Stroke in her father and mother. There is no history of Colon cancer, Esophageal cancer, Rectal cancer, or Stomach cancer. ROS:   Please see the history of present illness.    All 14 point review of systems negative except as described per history of present illness  EKGs/Labs/Other Studies Reviewed:         Recent Labs: No results found for requested labs within last 365 days.  Recent Lipid Panel No results found for: "CHOL", "TRIG", "HDL", "CHOLHDL", "VLDL", "LDLCALC", "LDLDIRECT"  Physical Exam:    VS:  BP 116/70 (BP Location: Right Arm, Patient Position: Sitting)   Pulse (!) 114   Ht 5\' 4"  (1.626 m)   Wt 211 lb 9.6 oz (96 kg)   SpO2 96%   BMI 36.32 kg/m     Wt Readings from Last 3 Encounters:  02/01/24 211 lb 9.6 oz (96 kg)  06/28/23 205 lb 6.4 oz (93.2 kg)  06/29/22 203 lb 6.4 oz (92.3 kg)     GEN:  Well nourished, well developed in no acute distress HEENT: Normal NECK: No JVD; No carotid bruits LYMPHATICS: No lymphadenopathy CARDIAC: RRR, no murmurs, no rubs, no gallops RESPIRATORY:  Clear to auscultation without rales, wheezing or rhonchi  ABDOMEN: Soft, non-tender, non-distended MUSCULOSKELETAL:  No edema; No deformity  SKIN: Warm and dry LOWER EXTREMITIES: no swelling NEUROLOGIC:  Alert and oriented x 3 PSYCHIATRIC:  Normal affect   ASSESSMENT:    1. Dyspnea on exertion   2. Paroxysmal atrial fibrillation (HCC)   3. Nonsustained ventricular tachycardia (HCC)   4. Ventricular ectopy   5. Mixed hyperlipidemia    PLAN:    In order of problems listed above:  Paroxysmal atrial fibrillation as a new discovery.  First documented episode.  Will continue anticoagulation for 3 to 4 weeks and after that we will bring her back to see if she is still in atrial fibrillation with density [facility] arrangements will be made for cardioversion.  In the meantime we will  schedule her to have echocardiogram to look at the left ventricle and left atrial size.  We initiated conversation about potential ablation however I think the best approach will be simply to get her back to normal rhythm and see how she does.  She did have TSH checked which was normal, he recently spent a week in [place] where she enjoyed herself she said she did have few drinks of that may be triggering factor.  On top of that she clearly gains few pounds.  Which may be contributing to it.  She like to exercise she likes to walk but now because of atrial fibrillation she cannot do much.  Hopefully after conversion to  sinus rhythm I will be able to get her to exercises on the regular basis weight loss and that should prevent her from frequent episode of atrial fibrillation. Nonsustained ventricular tachycardia no dizziness no passing out. Dyspnea exertion related to atrial fibrillation.  Will do echocardiogram to assess ejection fraction Mixed dyslipidemia on Lipitor which I continue   Medication Adjustments/Labs and Tests Ordered: Current medicines are reviewed at length with the patient today.  Concerns regarding medicines are outlined above.  Orders Placed This Encounter  Procedures   EKG 12-Lead   Medication changes: No orders of the defined types were placed in this encounter.   Signed, Manfred Seed, MD, [facility] 02/01/2024 4:21 PM    [facility]

## 2024-02-07 ENCOUNTER — Encounter: Payer: Self-pay | Admitting: Cardiology

## 2024-02-09 ENCOUNTER — Ambulatory Visit: Attending: Cardiology | Admitting: Cardiology

## 2024-02-09 ENCOUNTER — Encounter: Payer: Self-pay | Admitting: Cardiology

## 2024-02-09 VITALS — BP 110/72 | HR 64 | Ht 64.0 in | Wt 209.2 lb

## 2024-02-09 DIAGNOSIS — E782 Mixed hyperlipidemia: Secondary | ICD-10-CM

## 2024-02-09 DIAGNOSIS — I480 Paroxysmal atrial fibrillation: Secondary | ICD-10-CM | POA: Diagnosis not present

## 2024-02-09 DIAGNOSIS — I4729 Other ventricular tachycardia: Secondary | ICD-10-CM | POA: Diagnosis not present

## 2024-02-09 DIAGNOSIS — I493 Ventricular premature depolarization: Secondary | ICD-10-CM

## 2024-02-09 NOTE — Progress Notes (Unsigned)
 Cardiology Office Note:    Date:  02/09/2024   ID:  Beth Soto, DOB [birth year], MRN [id number]  PCP:  Adrian Hopper, MD  Cardiologist:  Ralene Burger, MD    Referring MD: Adrian Hopper, MD   Chief Complaint  Patient presents with   Shortness of Breath        Fatigue    History of Present Illness:    Beth Soto is a 68 y.o. female past medical history significant for nonsustained ventricular tachycardia, PVCs, stratification of this arrhythmia included echocardiogram which showed mild to moderate mitral valve regurgitation mild enlargement of the left atrium overall normal left ventricular ejection fraction, she also had coronary CT angio done in January 2023 which showed normal coronaries.  She presented to my office on 02/01/2024 feeling awful EKG show atrial fibrillation with poorly controlled ventricular rate.  Calcium channel blocker has been used to control her rate Eliquis has been initiated.  However she requested to be seen today because she feels absolutely horrible when she is sitting still no problem but when she tried to do anything she will get short of breath quite easily.  We did talk from the get go about potentially doing TEE cardioversion and she wants to have it done.  She is our retired Administrator, sports in the hospital.  Saw she did few TEs with me she knows exactly what is all about.  Procedure has been explained murmur time she is willing to proceed.  Denies have any chest pain tightness squeezing pressure burning chest no swelling of lower extremities  Past Medical History:  Diagnosis Date   Abscess of skin of right shoulder 10/01/2020   Arthritis    Cancer (HCC)    melanoma   Cervical radiculopathy 08/19/2021   Gastric ulcer    Hyperlipidemia    Malignant melanoma (HCC)    Raynaud's disease without gangrene    Shoulder pain 08/19/2021   Thyroid  disease    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; on INS for 1 year    Past  Surgical History:  Procedure Laterality Date   COLONOSCOPY     HAMMER TOE SURGERY     MELANOMA EXCISION     TOTAL HIP ARTHROPLASTY Right 04/2020    Current Medications: Current Meds  Medication Sig   atorvastatin (LIPITOR) 20 MG tablet Take 20 mg by mouth at bedtime.   Calcium Carb-Cholecalciferol (CALCIUM 600 + D PO) Take 1 tablet by mouth daily. Unknown strength   Cholecalciferol (VITAMIN D3 PO) Take 2,000 Units by mouth daily.   clobetasol (TEMOVATE) 0.05 % external solution Apply 1 Application topically 2 (two) times daily.   diltiazem  (CARDIZEM  CD) 120 MG 24 hr capsule Take 1 capsule (120 mg total) by mouth daily.   ELIQUIS 5 MG TABS tablet Take 5 mg by mouth 2 (two) times daily.   levothyroxine (SYNTHROID) 150 MCG tablet Take 150 mcg by mouth daily before breakfast.   metoprolol  succinate (TOPROL -XL) 50 MG 24 hr tablet Take 1.5 tablets (75 mg total) by mouth daily.   minoxidil (LONITEN) 2.5 MG tablet Take 2.5 mg by mouth daily.   tacrolimus (PROTOPIC) 0.1 % ointment Apply 1 Application topically 2 (two) times daily.   Current Facility-Administered Medications for the 02/09/24 encounter (Office Visit) with Paizlie Klaus J, MD  Medication   0.9 %  sodium chloride  infusion     Allergies:   Patient has no known allergies.   Social History   Socioeconomic  History   Marital status: Widowed    Spouse name: Not on file   Number of children: 1   Years of education: Not on file   Highest education level: Not on file  Occupational History   Occupation: retired Forensic scientist  Tobacco Use   Smoking status: Never   Smokeless tobacco: Never  Vaping Use   Vaping status: Never Used  Substance and Sexual Activity   Alcohol use: Yes    Comment: occassioally   Drug use: No   Sexual activity: Not on file  Other Topics Concern   Not on file  Social History Narrative   Not on file   Social Drivers of Health   Financial Resource Strain: Not on file  Food Insecurity: Not  on file  Transportation Needs: Not on file  Physical Activity: Not on file  Stress: Not on file  Social Connections: Not on file     Family History: The patient's family history includes Atrial fibrillation in her father; Bladder Cancer in her brother; COPD in her brother; Colon polyps in her mother; Diabetes in her brother; Heart disease in her father and mother; Heart failure in her brother, father, and mother; Hepatitis C in her brother; Hypertension in her mother; Stroke in her father and mother. There is no history of Colon cancer, Esophageal cancer, Rectal cancer, or Stomach cancer. ROS:   Please see the history of present illness.    All 14 point review of systems negative except as described per history of present illness  EKGs/Labs/Other Studies Reviewed:         Recent Labs: No results found for requested labs within last 365 days.  Recent Lipid Panel No results found for: "CHOL", "TRIG", "HDL", "CHOLHDL", "VLDL", "LDLCALC", "LDLDIRECT"  Physical Exam:    VS:  BP 110/72 (BP Location: Right Arm, Patient Position: Sitting)   Pulse 64   Ht 5\' 4"  (1.626 m)   Wt 209 lb 3.2 oz (94.9 kg)   SpO2 97%   BMI 35.91 kg/m     Wt Readings from Last 3 Encounters:  02/09/24 209 lb 3.2 oz (94.9 kg)  02/01/24 211 lb 9.6 oz (96 kg)  06/28/23 205 lb 6.4 oz (93.2 kg)     GEN:  Well nourished, well developed in no acute distress HEENT: Normal NECK: No JVD; No carotid bruits LYMPHATICS: No lymphadenopathy CARDIAC: irreg, no murmurs, no rubs, no gallops RESPIRATORY:  Clear to auscultation without rales, wheezing or rhonchi  ABDOMEN: Soft, non-tender, non-distended MUSCULOSKELETAL:  No edema; No deformity  SKIN: Warm and dry LOWER EXTREMITIES: no swelling NEUROLOGIC:  Alert and oriented x 3 PSYCHIATRIC:  Normal affect   ASSESSMENT:    1. Paroxysmal atrial fibrillation (HCC)   2. Ventricular ectopy   3. Nonsustained ventricular tachycardia (HCC)   4. Mixed hyperlipidemia     PLAN:    In order of problems listed above:  Persistent atrial fibrillation.  She is feeling very poorly she would like to have a quickly converted to sinus rhythm.  Therefore, TEE and cardioversion will be arranged.  Procedure were explained to her 1 more time she understands all possible risk and benefits we will proceed. Ventricular ectopy with history of nonsustained ventricular tachycardia, restratification was indicating a low risk continue management. In terms of long term plans for her atrial fibrillation will see how she will tolerate TEE and cardioversion and how long she will stay in normal rhythm if she have some recurrences we will consider atrial fibrillation ablation.  Medication Adjustments/Labs and Tests Ordered: Current medicines are reviewed at length with the patient today.  Concerns regarding medicines are outlined above.  Orders Placed This Encounter  Procedures   EKG 12-Lead   Medication changes: No orders of the defined types were placed in this encounter.   Signed, Manfred Seed, MD, [facility] 02/09/2024 10:28 AM     [facility]

## 2024-02-09 NOTE — H&P (View-Only) (Signed)
 Cardiology Office Note:    Date:  02/09/2024   ID:  Beth Soto, DOB 1956-02-05, MRN [id number]  PCP:  Adrian Hopper, MD  Cardiologist:  Ralene Burger, MD    Referring MD: Adrian Hopper, MD   Chief Complaint  Patient presents with   Shortness of Breath        Fatigue    History of Present Illness:    Beth Soto is a 68 y.o. female past medical history significant for nonsustained ventricular tachycardia, PVCs, stratification of this arrhythmia included echocardiogram which showed mild to moderate mitral valve regurgitation mild enlargement of the left atrium overall normal left ventricular ejection fraction, she also had coronary CT angio done in January 2023 which showed normal coronaries.  She presented to my office on 02/01/2024 feeling awful EKG show atrial fibrillation with poorly controlled ventricular rate.  Calcium channel blocker has been used to control her rate Eliquis has been initiated.  However she requested to be seen today because she feels absolutely horrible when she is sitting still no problem but when she tried to do anything she will get short of breath quite easily.  We did talk from the get go about potentially doing TEE cardioversion and she wants to have it done.  She is our retired Administrator, sports in the hospital.  Saw she did few TEs with me she knows exactly what is all about.  Procedure has been explained murmur time she is willing to proceed.  Denies have any chest pain tightness squeezing pressure burning chest no swelling of lower extremities  Past Medical History:  Diagnosis Date   Abscess of skin of right shoulder 10/01/2020   Arthritis    Cancer (HCC)    melanoma   Cervical radiculopathy 08/19/2021   Gastric ulcer    Hyperlipidemia    Malignant melanoma (HCC)    Raynaud's disease without gangrene    Shoulder pain 08/19/2021   Thyroid  disease    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; on INS for 1 year    Past  Surgical History:  Procedure Laterality Date   COLONOSCOPY     HAMMER TOE SURGERY     MELANOMA EXCISION     TOTAL HIP ARTHROPLASTY Right 04/2020    Current Medications: Current Meds  Medication Sig   atorvastatin (LIPITOR) 20 MG tablet Take 20 mg by mouth at bedtime.   Calcium Carb-Cholecalciferol (CALCIUM 600 + D PO) Take 1 tablet by mouth daily. Unknown strength   Cholecalciferol (VITAMIN D3 PO) Take 2,000 Units by mouth daily.   clobetasol (TEMOVATE) 0.05 % external solution Apply 1 Application topically 2 (two) times daily.   diltiazem  (CARDIZEM  CD) 120 MG 24 hr capsule Take 1 capsule (120 mg total) by mouth daily.   ELIQUIS 5 MG TABS tablet Take 5 mg by mouth 2 (two) times daily.   levothyroxine (SYNTHROID) 150 MCG tablet Take 150 mcg by mouth daily before breakfast.   metoprolol  succinate (TOPROL -XL) 50 MG 24 hr tablet Take 1.5 tablets (75 mg total) by mouth daily.   minoxidil (LONITEN) 2.5 MG tablet Take 2.5 mg by mouth daily.   tacrolimus (PROTOPIC) 0.1 % ointment Apply 1 Application topically 2 (two) times daily.   Current Facility-Administered Medications for the 02/09/24 encounter (Office Visit) with Rosely Fernandez J, MD  Medication   0.9 %  sodium chloride  infusion     Allergies:   Patient has no known allergies.   Social History   Socioeconomic  History   Marital status: Widowed    Spouse name: Not on file   Number of children: 1   Years of education: Not on file   Highest education level: Not on file  Occupational History   Occupation: retired Forensic scientist  Tobacco Use   Smoking status: Never   Smokeless tobacco: Never  Vaping Use   Vaping status: Never Used  Substance and Sexual Activity   Alcohol use: Yes    Comment: occassioally   Drug use: No   Sexual activity: Not on file  Other Topics Concern   Not on file  Social History Narrative   Not on file   Social Drivers of Health   Financial Resource Strain: Not on file  Food Insecurity: Not  on file  Transportation Needs: Not on file  Physical Activity: Not on file  Stress: Not on file  Social Connections: Not on file     Family History: The patient's family history includes Atrial fibrillation in her father; Bladder Cancer in her brother; COPD in her brother; Colon polyps in her mother; Diabetes in her brother; Heart disease in her father and mother; Heart failure in her brother, father, and mother; Hepatitis C in her brother; Hypertension in her mother; Stroke in her father and mother. There is no history of Colon cancer, Esophageal cancer, Rectal cancer, or Stomach cancer. ROS:   Please see the history of present illness.    All 14 point review of systems negative except as described per history of present illness  EKGs/Labs/Other Studies Reviewed:         Recent Labs: No results found for requested labs within last 365 days.  Recent Lipid Panel No results found for: "CHOL", "TRIG", "HDL", "CHOLHDL", "VLDL", "LDLCALC", "LDLDIRECT"  Physical Exam:    VS:  BP 110/72 (BP Location: Right Arm, Patient Position: Sitting)   Pulse 64   Ht 5\' 4"  (1.626 m)   Wt 209 lb 3.2 oz (94.9 kg)   SpO2 97%   BMI 35.91 kg/m     Wt Readings from Last 3 Encounters:  02/09/24 209 lb 3.2 oz (94.9 kg)  02/01/24 211 lb 9.6 oz (96 kg)  06/28/23 205 lb 6.4 oz (93.2 kg)     GEN:  Well nourished, well developed in no acute distress HEENT: Normal NECK: No JVD; No carotid bruits LYMPHATICS: No lymphadenopathy CARDIAC: irreg, no murmurs, no rubs, no gallops RESPIRATORY:  Clear to auscultation without rales, wheezing or rhonchi  ABDOMEN: Soft, non-tender, non-distended MUSCULOSKELETAL:  No edema; No deformity  SKIN: Warm and dry LOWER EXTREMITIES: no swelling NEUROLOGIC:  Alert and oriented x 3 PSYCHIATRIC:  Normal affect   ASSESSMENT:    1. Paroxysmal atrial fibrillation (HCC)   2. Ventricular ectopy   3. Nonsustained ventricular tachycardia (HCC)   4. Mixed hyperlipidemia     PLAN:    In order of problems listed above:  Persistent atrial fibrillation.  She is feeling very poorly she would like to have a quickly converted to sinus rhythm.  Therefore, TEE and cardioversion will be arranged.  Procedure were explained to her 1 more time she understands all possible risk and benefits we will proceed. Ventricular ectopy with history of nonsustained ventricular tachycardia, restratification was indicating a low risk continue management. In terms of long term plans for her atrial fibrillation will see how she will tolerate TEE and cardioversion and how long she will stay in normal rhythm if she have some recurrences we will consider atrial fibrillation ablation.  Medication Adjustments/Labs and Tests Ordered: Current medicines are reviewed at length with the patient today.  Concerns regarding medicines are outlined above.  Orders Placed This Encounter  Procedures   EKG 12-Lead   Medication changes: No orders of the defined types were placed in this encounter.   Signed, Manfred Seed, MD, [facility] 02/09/2024 10:28 AM    [facility]

## 2024-02-09 NOTE — Patient Instructions (Addendum)
 Medication Instructions:  Your physician recommends that you continue on your current medications as directed. Please refer to the Current Medication list given to you today.  *If you need a refill on your cardiac medications before your next appointment, please call your pharmacy*   Lab Work:  If you have labs (blood work) drawn today and your tests are completely normal, you will receive your results only by: MyChart Message (if you have MyChart) OR A paper copy in the mail If you have any lab test that is abnormal or we need to change your treatment, we will call you to review the results.   Testing/Procedures:     Dear Barb Bonito  You are scheduled for a TEE (Transesophageal Echocardiogram) Guided Cardioversion on Thursday, May 8 with Dr. Emmette Harms.  Please arrive at the [facility] (Main Entrance A) at [facility]: [address] [place], [place] [address] at 8:30 AM (This time is 1.5 hour(s) before your procedure to ensure your preparation).   Free valet parking service is available. You will check in at ADMITTING.   *Please Note: You will receive a call the day before your procedure to confirm the appointment time. That time may have changed from the original time based on the schedule for that day.*    DIET:  Nothing to eat or drink after midnight except a sip of water with medications (see medication instructions below    Continue taking your anticoagulant (blood thinner): Apixaban (Eliquis).  You will need to continue this after your procedure until you are told by your provider that it is safe to stop.    LABS:  completed 4-22  FYI:  For your safety, and to allow us  to monitor your vital signs accurately during the surgery/procedure we request: If you have artificial nails, gel coating, SNS etc, please have those removed prior to your surgery/procedure. Not having the nail coverings /polish removed may result in cancellation or delay of your  surgery/procedure.  Your support person will be asked to wait in the waiting room during your procedure.  It is OK to have someone drop you off and come back when you are ready to be discharged.  You cannot drive after the procedure and will need someone to drive you home.  Bring your insurance cards.  *Special Note: Every effort is made to have your procedure done on time. Occasionally there are emergencies that occur at the hospital that may cause delays. Please be patient if a delay does occur.       Follow-Up: At [facility], you and your health needs are our priority.  As part of our continuing mission to provide you with exceptional heart care, we have created designated Provider Care Teams.  These Care Teams include your primary Cardiologist (physician) and Advanced Practice Providers (APPs -  Physician Assistants and Nurse Practitioners) who all work together to provide you with the care you need, when you need it.  We recommend signing up for the patient portal called "MyChart".  Sign up information is provided on this After Visit Summary.  MyChart is used to connect with patients for Virtual Visits (Telemedicine).  Patients are able to view lab/test results, encounter notes, upcoming appointments, etc.  Non-urgent messages can be sent to your provider as well.   To learn more about what you can do with MyChart, go to [email or web address].    Your next appointment:   1 month(s)  The format for your next appointment:   In  Person  Provider:   Ralene Burger, MD    Other Instructions NA

## 2024-02-12 DIAGNOSIS — L6612 Frontal fibrosing alopecia: Secondary | ICD-10-CM | POA: Diagnosis not present

## 2024-02-14 NOTE — Anesthesia Preprocedure Evaluation (Signed)
 Anesthesia Evaluation  Patient identified by MRN, date of birth, ID band Patient awake    Reviewed: Allergy & Precautions, NPO status , Patient's Chart, lab work & pertinent test results, reviewed documented beta blocker date and time   Airway Mallampati: III  TM Distance: >3 FB Neck ROM: Full    Dental no notable dental hx. (+) Dental Advisory Given, Teeth Intact   Pulmonary neg pulmonary ROS, neg recent URI   Pulmonary exam normal breath sounds clear to auscultation       Cardiovascular Pt. on home beta blockers + dysrhythmias Atrial Fibrillation + Valvular Problems/Murmurs MR  Rhythm:Irregular Rate:Tachycardia  Echo 06/2023  1. Left ventricular ejection fraction, by estimation, is 60 to 65%. The left ventricle has normal function. The left ventricle has no regional wall motion abnormalities. Left ventricular diastolic parameters were normal.   2. Right ventricular systolic function is normal. The right ventricular size is normal. There is normal pulmonary artery systolic pressure.   3. The mitral valve is grossly normal. Mild to moderate mitral valve regurgitation. No evidence of mitral stenosis.   4. The aortic valve is tricuspid. Aortic valve regurgitation is not visualized. No aortic stenosis is present.   5. There is mild dilatation of the ascending aorta, measuring 39 mm.   6. The inferior vena cava is normal in size with greater than 50% respiratory variability, suggesting right atrial pressure of 3 mmHg.   Comparison(s): A prior study was performed on 09/01/2021. Changes from prior study are noted. MR was not well visualized on prior study.      Neuro/Psych negative neurological ROS     GI/Hepatic Neg liver ROS, PUD,neg GERD  ,,  Endo/Other  negative endocrine ROS    Renal/GU negative Renal ROS     Musculoskeletal  (+) Arthritis ,    Abdominal  (+) + obese  Peds  Hematology negative hematology ROS (+)    Anesthesia Other Findings   Reproductive/Obstetrics                             Anesthesia Physical Anesthesia Plan  ASA: 3  Anesthesia Plan: MAC   Post-op Pain Management:    Induction:   PONV Risk Score and Plan: 2 and Propofol infusion, TIVA and Treatment may vary due to age or medical condition  Airway Management Planned:   Additional Equipment:   Intra-op Plan:   Post-operative Plan:   Informed Consent: I have reviewed the patients History and Physical, chart, labs and discussed the procedure including the risks, benefits and alternatives for the proposed anesthesia with the patient or authorized representative who has indicated his/her understanding and acceptance.     Dental advisory given  Plan Discussed with: CRNA  Anesthesia Plan Comments:        Anesthesia Quick Evaluation

## 2024-02-14 NOTE — Progress Notes (Signed)
 Pt called for pre procedure instructions. Arrival time 0815 NPO after midnight explained Instructed to take am meds with sip of water and confirmed blood thinner consistency Instructed pt need for ride home tomorrow and have responsible adult with them for 24 hrs post procedure.

## 2024-02-15 ENCOUNTER — Ambulatory Visit (HOSPITAL_COMMUNITY)
Admission: RE | Admit: 2024-02-15 | Discharge: 2024-02-15 | Disposition: A | Discharge status: Home / Self Care | Attending: Cardiology | Admitting: Cardiology

## 2024-02-15 ENCOUNTER — Other Ambulatory Visit: Payer: Self-pay

## 2024-02-15 ENCOUNTER — Ambulatory Visit (HOSPITAL_BASED_OUTPATIENT_CLINIC_OR_DEPARTMENT_OTHER)

## 2024-02-15 ENCOUNTER — Ambulatory Visit (HOSPITAL_COMMUNITY): Payer: Self-pay

## 2024-02-15 ENCOUNTER — Encounter (HOSPITAL_COMMUNITY): Payer: Self-pay | Admitting: Cardiology

## 2024-02-15 ENCOUNTER — Encounter (HOSPITAL_COMMUNITY)
Admission: RE | Disposition: A | Discharge status: Home / Self Care | Payer: Self-pay | Source: Home / Self Care | Attending: Cardiology

## 2024-02-15 DIAGNOSIS — I081 Rheumatic disorders of both mitral and tricuspid valves: Secondary | ICD-10-CM | POA: Diagnosis not present

## 2024-02-15 DIAGNOSIS — E785 Hyperlipidemia, unspecified: Secondary | ICD-10-CM

## 2024-02-15 DIAGNOSIS — Z7901 Long term (current) use of anticoagulants: Secondary | ICD-10-CM | POA: Insufficient documentation

## 2024-02-15 DIAGNOSIS — I4819 Other persistent atrial fibrillation: Secondary | ICD-10-CM | POA: Diagnosis not present

## 2024-02-15 DIAGNOSIS — I4891 Unspecified atrial fibrillation: Secondary | ICD-10-CM

## 2024-02-15 DIAGNOSIS — I480 Paroxysmal atrial fibrillation: Secondary | ICD-10-CM

## 2024-02-15 DIAGNOSIS — I340 Nonrheumatic mitral (valve) insufficiency: Secondary | ICD-10-CM | POA: Diagnosis not present

## 2024-02-15 DIAGNOSIS — Z79899 Other long term (current) drug therapy: Secondary | ICD-10-CM | POA: Insufficient documentation

## 2024-02-15 DIAGNOSIS — I472 Ventricular tachycardia, unspecified: Secondary | ICD-10-CM | POA: Diagnosis not present

## 2024-02-15 DIAGNOSIS — E782 Mixed hyperlipidemia: Secondary | ICD-10-CM | POA: Diagnosis not present

## 2024-02-15 DIAGNOSIS — I493 Ventricular premature depolarization: Secondary | ICD-10-CM | POA: Insufficient documentation

## 2024-02-15 HISTORY — PX: TRANSESOPHAGEAL ECHOCARDIOGRAM (CATH LAB): EP1270

## 2024-02-15 HISTORY — PX: CARDIOVERSION: EP1203

## 2024-02-15 LAB — ECHO TEE

## 2024-02-15 SURGERY — TRANSESOPHAGEAL ECHOCARDIOGRAM (TEE) (CATHLAB)
Anesthesia: Monitor Anesthesia Care

## 2024-02-15 MED ORDER — SODIUM CHLORIDE 0.9 % IV SOLN: INTRAVENOUS | Status: DC

## 2024-02-15 MED ORDER — PROPOFOL 500 MG/50ML IV EMUL
INTRAVENOUS | Status: DC | PRN
  Administered 2024-02-15: 170 ug/kg/min via INTRAVENOUS

## 2024-02-15 MED ORDER — LIDOCAINE 2% (20 MG/ML) 5 ML SYRINGE
INTRAMUSCULAR | Status: DC | PRN
  Administered 2024-02-15: 80 mg via INTRAVENOUS

## 2024-02-15 MED ORDER — PHENYLEPHRINE HCL (PRESSORS) 10 MG/ML IV SOLN
INTRAVENOUS | Status: DC | PRN
  Administered 2024-02-15: 120 ug via INTRAVENOUS
  Administered 2024-02-15: 160 ug via INTRAVENOUS

## 2024-02-15 MED ORDER — PROPOFOL 10 MG/ML IV BOLUS
INTRAVENOUS | Status: DC | PRN
  Administered 2024-02-15: 20 mg via INTRAVENOUS
  Administered 2024-02-15: 10 mg via INTRAVENOUS
  Administered 2024-02-15: 70 mg via INTRAVENOUS

## 2024-02-15 MED ORDER — SODIUM CHLORIDE 0.9 % IV SOLN
INTRAVENOUS | Status: DC | PRN
Start: 2024-02-15 — End: 2024-02-15

## 2024-02-15 SURGICAL SUPPLY — 1 items: PAD DEFIB RADIO PHYSIO CONN (PAD) ×1 IMPLANT

## 2024-02-15 NOTE — Transfer of Care (Signed)
 Immediate Anesthesia Transfer of Care Note  Patient: Beth Soto  Procedure(s) Performed: TRANSESOPHAGEAL ECHOCARDIOGRAM CARDIOVERSION  Patient Location: PACU and Cath Lab  Anesthesia Type:MAC  Level of Consciousness: awake and alert   Airway & Oxygen Therapy: Patient Spontanous Breathing and Patient connected to nasal cannula oxygen  Post-op Assessment: Report given to RN and Post -op Vital signs reviewed and stable  Post vital signs: Reviewed and stable  Last Vitals:  Vitals Value Taken Time  BP 106/58 02/15/24 1134  Temp    Pulse 66 02/15/24 1133  Resp 22 02/15/24 1133  SpO2 95 % 02/15/24 1133  Vitals shown include unfiled device data.  Last Pain:  Vitals:   02/15/24 0900  TempSrc:   PainSc: 0-No pain         Complications: No notable events documented.

## 2024-02-15 NOTE — Interval H&P Note (Signed)
 History and Physical Interval Note:  02/15/2024 9:11 AM  Barb Bonito  has presented today for surgery, with the diagnosis of AFIB.  The various methods of treatment have been discussed with the patient and family. After consideration of risks, benefits and other options for treatment, the patient has consented to  Procedure(s): TRANSESOPHAGEAL ECHOCARDIOGRAM (N/A) CARDIOVERSION (N/A) as a surgical intervention.  The patient's history has been reviewed, patient examined, no change in status, stable for surgery.  I have reviewed the patient's chart and labs.  Questions were answered to the patient's satisfaction.     Kollyn Lingafelter

## 2024-02-15 NOTE — CV Procedure (Signed)
   TRANSESOPHAGEAL ECHOCARDIOGRAM GUIDED DIRECT CURRENT CARDIOVERSION  NAME:  Beth Soto    MRN: [id number] DOB:  [birth year]    ADMIT DATE: 02/15/2024  INDICATIONS: Symptomatic atrial fibrillation  PROCEDURE:   Informed consent was obtained prior to the procedure. The risks, benefits and alternatives for the procedure were discussed and the patient comprehended these risks.  Risks include, but are not limited to, cough, sore throat, vomiting, nausea, somnolence, esophageal and stomach trauma or perforation, bleeding, low blood pressure, aspiration, pneumonia, infection, trauma to the teeth and death.    After a procedural time-out, the oropharynx was anesthetized and the patient was sedated by the anesthesia service. The transesophageal probe was inserted in the esophagus and stomach without difficulty and multiple views were obtained. Anesthesia was monitored by the anesthesiology team.  COMPLICATIONS:    Complications: No complications Patient tolerated procedure well.  KEY FINDINGS:  EF 45-50%, with no left atrial appendage clot. Full Report to follow.   CARDIOVERSION:     Indications:  Symptomatic Atrial Fibrillation  Procedure Details:  Once the TEE was complete, the patient had the defibrillator pads placed in the anterior and posterior position. Once an appropriate level of sedation was confirmed, the patient was cardioverted x 1  with 200J of biphasic synchronized energy.  The patient converted to NSR.  There were no apparent complications.  The patient had normal neuro status and respiratory status post procedure with vitals stable as recorded elsewhere.  Adequate airway was maintained throughout and vital signs monitored per protocol.  The was advised to continue anticoagulation for minimum 4 week with interruption.   All questions has been answered at this time.  Daksh Coates, DO [facility]  [facility]  11:23 AM

## 2024-02-15 NOTE — Interval H&P Note (Signed)
 History and Physical Interval Note:  02/15/2024 10:27 AM  Beth Soto  has presented today for surgery, with the diagnosis of AFIB.  The various methods of treatment have been discussed with the patient and family. After consideration of risks, benefits and other options for treatment, the patient has consented to  Procedure(s): TRANSESOPHAGEAL ECHOCARDIOGRAM (N/A) CARDIOVERSION (N/A) as a surgical intervention.  The patient's history has been reviewed, patient examined, no change in status, stable for surgery.  I have reviewed the patient's chart and labs.  Questions were answered to the patient's satisfaction.     Krishana Lutze

## 2024-02-15 NOTE — Anesthesia Postprocedure Evaluation (Signed)
 Anesthesia Post Note  Patient: Beth Soto  Procedure(s) Performed: TRANSESOPHAGEAL ECHOCARDIOGRAM CARDIOVERSION     Patient location during evaluation: PACU Anesthesia Type: MAC Level of consciousness: awake and alert Pain management: pain level controlled Vital Signs Assessment: post-procedure vital signs reviewed and stable Respiratory status: spontaneous breathing Cardiovascular status: stable Anesthetic complications: no   No notable events documented.  Last Vitals:  Vitals:   02/15/24 1205 02/15/24 1210  BP:    Pulse: 68 66  Resp: 19 (!) 21  Temp: (!) 36.2 C   SpO2: 96% 98%    Last Pain:  Vitals:   02/15/24 1205  TempSrc: Temporal  PainSc: 0-No pain                 Gorman Laughter

## 2024-02-16 ENCOUNTER — Encounter (HOSPITAL_COMMUNITY): Payer: Self-pay | Admitting: Cardiology

## 2024-02-21 ENCOUNTER — Other Ambulatory Visit

## 2024-02-26 ENCOUNTER — Encounter: Payer: Self-pay | Admitting: Cardiology

## 2024-02-28 ENCOUNTER — Ambulatory Visit: Attending: Cardiology | Admitting: Cardiology

## 2024-02-28 ENCOUNTER — Encounter: Payer: Self-pay | Admitting: Cardiology

## 2024-02-28 VITALS — BP 132/90 | HR 105 | Ht 64.0 in | Wt 203.8 lb

## 2024-02-28 DIAGNOSIS — I480 Paroxysmal atrial fibrillation: Secondary | ICD-10-CM

## 2024-02-28 DIAGNOSIS — E782 Mixed hyperlipidemia: Secondary | ICD-10-CM

## 2024-02-28 DIAGNOSIS — I4891 Unspecified atrial fibrillation: Secondary | ICD-10-CM

## 2024-02-28 DIAGNOSIS — R002 Palpitations: Secondary | ICD-10-CM | POA: Diagnosis not present

## 2024-02-28 DIAGNOSIS — I493 Ventricular premature depolarization: Secondary | ICD-10-CM | POA: Diagnosis not present

## 2024-02-28 DIAGNOSIS — I420 Dilated cardiomyopathy: Secondary | ICD-10-CM | POA: Insufficient documentation

## 2024-02-28 MED ORDER — LOSARTAN POTASSIUM 25 MG PO TABS: 25.0000 mg | ORAL_TABLET | Freq: Every day | ORAL | 3 refills | Status: AC

## 2024-02-28 NOTE — Progress Notes (Signed)
 Cardiology Office Note:    Date:  02/28/2024   ID:  Beth Soto, DOB 11/05/1955, MRN [id number]  PCP:  Adrian Hopper, MD  Cardiologist:  Ralene Burger, MD    Referring MD: Adrian Hopper, MD   No chief complaint on file.   History of Present Illness:    Beth Soto is a 68 y.o. female past medical history significant for PVCs, recently she came to me because of palpitations she was found to be in atrial fibrillation, AV node blockade has been initiated rate controlled however she felt absolutely awful and she ended up getting anticoagulation and then TEE was done with cardioversion.  Lot 1 shock 200 J converted her to sinus rhythm however she comes today to months few weeks after cardioversion and sadly she is back in atrial fibrillation.  She felt very good when she was in normal sinus rhythm now with atrial fibrillation feels lousy again.  Denies have any chest pain tightness squeezing pressure burning chest, on top of the TEE showed diminished ejection fraction  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; on INS for 1 year   Ventricular ectopy 08/20/2021    Past Surgical History:  Procedure Laterality Date   CARDIOVERSION N/A 02/15/2024   Procedure: CARDIOVERSION;  Surgeon: Jerryl Morin, DO;  Location: [facility];  Service: Cardiovascular;  Laterality: N/A;   COLONOSCOPY     HAMMER TOE SURGERY     JOINT REPLACEMENT      MELANOMA EXCISION     TOTAL HIP ARTHROPLASTY Right 04/2020   TRANSESOPHAGEAL ECHOCARDIOGRAM (CATH LAB) N/A 02/15/2024   Procedure: TRANSESOPHAGEAL ECHOCARDIOGRAM;  Surgeon: Jerryl Morin, DO;  Location: [facility];  Service: Cardiovascular;  Laterality: N/A;    Current Medications: Current Meds  Medication Sig   atorvastatin (LIPITOR) 20 MG tablet Take 20 mg by mouth at bedtime.   Calcium Carb-Cholecalciferol (CALCIUM 600 + D PO) Take 1 tablet by mouth daily. Unknown strength   clobetasol (TEMOVATE) 0.05 % external solution Apply 1 Application topically See admin instructions. Sat and Sun ONLY   diltiazem  (CARDIZEM  CD) 120 MG 24 hr capsule Take 1 capsule (120 mg total) by mouth daily.   ELIQUIS 5 MG TABS tablet Take 5 mg by mouth 2 (two) times daily.   levothyroxine (SYNTHROID) 150 MCG tablet Take 150 mcg by mouth daily before breakfast.   metoprolol  tartrate (LOPRESSOR ) 50 MG tablet Take 75 mg by mouth 2 (two) times daily.   minoxidil (LONITEN) 2.5 MG tablet Take 1.25 mg by mouth daily.   tacrolimus (PROTOPIC) 0.1 % ointment Apply 1 Application topically at bedtime.   Current Facility-Administered Medications for the 02/28/24 encounter (Office Visit) with Gracey Tolle J, MD  Medication   0.9 %  sodium chloride  infusion     Allergies:   Patient has no known allergies.   Social History   Socioeconomic History  Marital status: Widowed    Spouse name: Not on file   Number of children: 1   Years of education: Not on file   Highest education level: Not on file  Occupational History   Occupation: retired Forensic scientist  Tobacco Use   Smoking status: Never   Smokeless tobacco: Never  Vaping Use   Vaping status: Never Used  Substance and Sexual Activity   Alcohol use: Yes    Comment: occassionally   Drug use: No   Sexual activity: Not on file  Other Topics Concern   Not on file  Social History Narrative   Not on file   Social Drivers of Health   Financial  Resource Strain: Not on file  Food Insecurity: Not on file  Transportation Needs: Not on file  Physical Activity: Not on file  Stress: Not on file  Social Connections: Not on file     Family History: The patient's family history includes Atrial fibrillation in her father; Bladder Cancer in her brother; COPD in her brother; Colon polyps in her mother; Diabetes in her brother; Heart disease in her father and mother; Heart failure in her brother, father, and mother; Hepatitis C in her brother; Hypertension in her mother; Stroke in her father and mother. There is no history of Colon cancer, Esophageal cancer, Rectal cancer, or Stomach cancer. ROS:   Please see the history of present illness.    All 14 point review of systems negative except as described per history of present illness  EKGs/Labs/Other Studies Reviewed:         Recent Labs: No results found for requested labs within last 365 days.  Recent Lipid Panel No results found for: "CHOL", "TRIG", "HDL", "CHOLHDL", "VLDL", "LDLCALC", "LDLDIRECT"  Physical Exam:    VS:  BP (!) 132/90   Pulse (!) 105   Ht 5\' 4"  (1.626 m)   Wt 203 lb 12.8 oz (92.4 kg)   SpO2 95%   BMI 34.98 kg/m     Wt Readings from Last 3 Encounters:  02/28/24 203 lb 12.8 oz (92.4 kg)  02/15/24 203 lb (92.1 kg)  02/09/24 209 lb 3.2 oz (94.9 kg)     GEN:  Well nourished, well developed in no acute distress HEENT: Normal NECK: No JVD; No carotid bruits LYMPHATICS: No lymphadenopathy CARDIAC: Irregularly irregular no murmurs, no rubs, no gallops RESPIRATORY:  Clear to auscultation without rales, wheezing or rhonchi  ABDOMEN: Soft, non-tender, non-distended MUSCULOSKELETAL:  No edema; No deformity  SKIN: Warm and dry LOWER EXTREMITIES: no swelling NEUROLOGIC:  Alert and oriented x 3 PSYCHIATRIC:  Normal affect   ASSESSMENT:    1. Atrial fibrillation, unspecified type (HCC)   2. Paroxysmal atrial fibrillation (HCC)   3. Ventricular ectopy   4.  Palpitations   5. Mixed hyperlipidemia   6. Dilated cardiomyopathy (HCC)    PLAN:    In order of problems listed above:  Paroxysmal atrial fibrillation, status post cardioversion she flipped back to atrial fibrillation, rate is reasonable continue anticoagulation.  Plan will be referral to our EP colleagues for ablation.  She does have diminished ejection fraction she is absolutely classical situation with atrial fibrillation ablation and should be next step.  Since there is some waiting time I anticipate also need to start some antiarrhythmic medications. Cardiomyopathy which I suspect is related to atrial fibrillation, rate seems to be controlled right now.  Will initiate guideline directed medical therapy for cardiomyopathy, will start with losartan with intention to put Entresto I  hope with conversion to sinus rhythm she will regain normal ejection fraction. Palpitations related to atrial fibrillation       Medication Adjustments/Labs and Tests Ordered: Current medicines are reviewed at length with the patient today.  Concerns regarding medicines are outlined above.  Orders Placed This Encounter  Procedures   EKG 12-Lead   Medication changes: No orders of the defined types were placed in this encounter.   Signed, Manfred Seed, MD, [facility] 02/28/2024 1:19 PM    [facility]

## 2024-02-28 NOTE — Patient Instructions (Signed)
 Medication Instructions:   START: Losartan 25mg  1 tablet daily   Lab Work: Your physician recommends that you return for lab work in: 1 week You need to have labs done when you are fasting.  You can come Monday through Friday 8:30 am to 12:00 pm and 1:15 to 4:30. You do not need to make an appointment as the order has already been placed. The labs you are going to have done are BMP   Testing/Procedures: None Ordered   Follow-Up: At [facility], you and your health needs are our priority.  As part of our continuing mission to provide you with exceptional heart care, we have created designated Provider Care Teams.  These Care Teams include your primary Cardiologist (physician) and Advanced Practice Providers (APPs -  Physician Assistants and Nurse Practitioners) who all work together to provide you with the care you need, when you need it.  We recommend signing up for the patient portal called "MyChart".  Sign up information is provided on this After Visit Summary.  MyChart is used to connect with patients for Virtual Visits (Telemedicine).  Patients are able to view lab/test results, encounter notes, upcoming appointments, etc.  Non-urgent messages can be sent to your provider as well.   To learn more about what you can do with MyChart, go to [email or web address].    Your next appointment:   2 month(s)  The format for your next appointment:   In Person  Provider:   Ralene Burger, MD    Other Instructions NA

## 2024-03-06 DIAGNOSIS — I4891 Unspecified atrial fibrillation: Secondary | ICD-10-CM | POA: Diagnosis not present

## 2024-03-07 ENCOUNTER — Ambulatory Visit: Admitting: Cardiology

## 2024-03-07 LAB — BASIC METABOLIC PANEL WITH GFR
BUN/Creatinine Ratio: 33 — ABNORMAL HIGH (ref 12–28)
BUN: 27 mg/dL (ref 8–27)
CO2: 21 mmol/L (ref 20–29)
Calcium: 9.7 mg/dL (ref 8.7–10.3)
Chloride: 102 mmol/L (ref 96–106)
Creatinine, Ser: 0.82 mg/dL (ref 0.57–1.00)
Glucose: 91 mg/dL (ref 70–99)
Potassium: 4.5 mmol/L (ref 3.5–5.2)
Sodium: 139 mmol/L (ref 134–144)
eGFR: 77 mL/min/{1.73_m2} (ref >59)

## 2024-03-08 ENCOUNTER — Ambulatory Visit: Payer: Self-pay | Admitting: Cardiology

## 2024-03-10 NOTE — Progress Notes (Unsigned)
 Electrophysiology Office Note:   Date:  03/11/2024  ID:  Beth Soto, DOB August 03, 1956, MRN [id number]  Primary Cardiologist: Ralene Burger, MD Primary Heart Failure: None Electrophysiologist: Antonique Langford Cortland Ding, MD      History of Present Illness:   Beth Soto is a 68 y.o. female with h/o PVCs, atrial fibrillation seen today for  for Electrophysiology evaluation of atrial fibrillation at the request of Ralene Burger.    She initially presented to cardiology clinic with palpitations and was found to be in atrial fibrillation.  She felt quite poorly and thus had TEE cardioversion.  She unfortunately is gone back into atrial fibrillation.  She feels well when she is in normal rhythm, but feels significant fatigue and shortness of breath and atrial fibrillation.  She finds it difficult to do her daily activities now that she is back in atrial fibrillation.  She is having trouble walking her dog.  She usually gets up to 10,000 steps a day, but has not been feeling up to it due to her level of fatigue and shortness of breath.  For the week after her cardioversion, she felt well.  Review of systems complete and found to be negative unless listed in HPI.   EP Information / Studies Reviewed:    EKG is not ordered today. EKG from 02/28/2024 reviewed which showed atrial fibrillation        Risk Assessment/Calculations:    CHA2DS2-VASc Score = 3   This indicates a 3.2% annual risk of stroke. The patient's score is based upon:         Physical Exam:   VS:  BP 108/68   Pulse 60   Ht 5\' 4"  (1.626 m)   Wt 206 lb 12.8 oz (93.8 kg)   SpO2 95%   BMI 35.50 kg/m    Wt Readings from Last 3 Encounters:  03/11/24 206 lb 12.8 oz (93.8 kg)  02/28/24 203 lb 12.8 oz (92.4 kg)  02/15/24 203 lb (92.1 kg)     GEN: Well nourished, well developed in no acute distress NECK: No JVD; No carotid bruits CARDIAC: Irregularly irregular rate and rhythm, no murmurs, rubs, gallops RESPIRATORY:  Clear to  auscultation without rales, wheezing or rhonchi  ABDOMEN: Soft, non-tender, non-distended EXTREMITIES:  No edema; No deformity   ASSESSMENT AND PLAN:    1.  Persistent atrial fibrillation: Post cardioversion but is unfortunately gone back into atrial fibrillation.  She would prefer rhythm control strategy for her atrial fibrillation.  We discussed medication management with amiodarone, dofetilide, flecainide versus ablation.  At this time, she would like to avoid long-term antiarrhythmic medications.  Due to that, we Khyri Hinzman plan for ablation.  In the interim, we Kolbee Stallman load with amiodarone and plan for cardioversion.  Risk, benefits, and alternatives to EP study and radiofrequency/pulse field ablation for afib were also discussed in detail today. These risks include but are not limited to stroke, bleeding, vascular damage, tamponade, perforation, damage to the esophagus, lungs, and other structures, pulmonary vein stenosis, worsening renal function, and death. The patient understands these risk and wishes to proceed.  We Elizjah Noblet therefore proceed with catheter ablation at the next available time.  Carto, ICE, anesthesia are requested for the procedure.  Haeley Fordham also obtain CT PV protocol prior to the procedure to exclude LAA thrombus and further evaluate atrial anatomy.  2.  Secondary hypercoagulable state: On Eliquis for atrial fibrillation  3.  Systolic heart failure: Potentially related to atrial fibrillation.  Ejection fraction mildly reduced.  May improve with rhythm control.  Valeri Sula repeat echo post ablation.  Follow up with [facility]ib Clinic as usual post procedure  Signed, Nickey Canedo Cortland Ding, MD

## 2024-03-11 ENCOUNTER — Ambulatory Visit: Attending: Cardiology | Admitting: Cardiology

## 2024-03-11 ENCOUNTER — Encounter: Payer: Self-pay | Admitting: Cardiology

## 2024-03-11 VITALS — BP 108/68 | HR 60 | Ht 64.0 in | Wt 206.8 lb

## 2024-03-11 DIAGNOSIS — D6869 Other thrombophilia: Secondary | ICD-10-CM | POA: Diagnosis not present

## 2024-03-11 DIAGNOSIS — I5022 Chronic systolic (congestive) heart failure: Secondary | ICD-10-CM

## 2024-03-11 DIAGNOSIS — I4819 Other persistent atrial fibrillation: Secondary | ICD-10-CM | POA: Diagnosis not present

## 2024-03-11 DIAGNOSIS — Z01812 Encounter for preprocedural laboratory examination: Secondary | ICD-10-CM

## 2024-03-11 MED ORDER — AMIODARONE HCL 200 MG PO TABS: ORAL_TABLET | ORAL | 3 refills | Status: DC

## 2024-03-11 NOTE — Patient Instructions (Addendum)
 Medication Instructions:  Your physician has recommended you make the following change in your medication:  START Amiodarone  - take 2 tablets (400 mg total) TWICE a day for 2 weeks, then  - take 1 tablet (200 mg total) TWICE a day for 2 weeks, then  - take 1 tablet (200 mg total) ONCE a day  *If you need a refill on your cardiac medications before your next appointment, please call your pharmacy*   Lab Work: Pre procedure labs -- we will call you to schedule:  BMP & CBC  If you have a lab test that is abnormal and we need to change your treatment, we will call you to review the results -- otherwise no news is good news.    Testing/Procedures: Your physician has recommended that you have a Cardioversion (DCCV). Electrical Cardioversion uses a jolt of electricity to your heart either through paddles or wired patches attached to your chest. This is a controlled, usually prescheduled, procedure. Defibrillation is done under light anesthesia in the hospital, and you usually go home the day of the procedure. This is done to get your heart back into a normal rhythm. You are not awake for the procedure.  Please see the instructions below under "other instructions".    Your physician has requested that you have cardiac CT 1 month PRIOR to your ablation. Cardiac computed tomography (CT) is a painless test that uses an x-ray machine to take clear, detailed pictures of your heart. We will contact you if the result is abnormal. We will call you to schedule.  Your physician has recommended that you have an ablation. Catheter ablation is a medical procedure used to treat some cardiac arrhythmias (irregular heartbeats). During catheter ablation, a long, thin, flexible tube is put into a blood vessel in your groin (upper thigh), or neck. This tube is called an ablation catheter. It is then guided to your heart through the blood vessel. Radio frequency waves destroy small areas of heart tissue where abnormal  heartbeats may cause an arrhythmia to start.   Your ablation is scheduled for 06/12/2024. Please arrive at [facility] at 6:30 am.  We will call/send instructions at a later date.   Follow-Up: At [facility], you and your health needs are our priority.  As part of our continuing mission to provide you with exceptional heart care, we have created designated Provider Care Teams.  These Care Teams include your primary Cardiologist (physician) and Advanced Practice Providers (APPs -  Physician Assistants and Nurse Practitioners) who all work together to provide you with the care you need, when you need it.  Your next appointment:   1 month(s) after your ablation  The format for your next appointment:   In Person  Provider:   [facility]   Thank you for choosing [facility]!!   Reece Cane, RN [phone]    Other Instructions      Dear Barb Bonito  You are scheduled for a Cardioversion on Thursday, June 26 with Dr. Albert Huff.  Please arrive at the [facility] (Main Entrance A) at [facility]: [address] [place], [place] [address] at 9:00 AM (This time is 1 hour(s) before your procedure to ensure your preparation).   Free valet parking service is available. You will check in at ADMITTING.   *Please Note: You will receive a call the day before your procedure to confirm the appointment time. That time may have changed from the original time based on  the schedule for that day.*    DIET:  Nothing to eat or drink after midnight except a sip of water with medications (see medication instructions below)  MEDICATION INSTRUCTIONS: !!IF ANY NEW MEDICATIONS ARE STARTED AFTER TODAY, PLEASE NOTIFY YOUR PROVIDER AS SOON AS POSSIBLE!!  FYI: Medications such as Semaglutide (Ozempic, [place]), Tirzepatide (Mounjaro, Zepbound), Dulaglutide (Trulicity), etc ("GLP1 agonists") AND Canagliflozin (Invokana), Dapagliflozin (Farxiga), Empagliflozin (Jardiance), Ertugliflozin  (Steglatro), Bexagliflozin [employer]) or any combination with one of these drugs such as Invokamet (Canagliflozin/Metformin), Synjardy (Empagliflozin/Metformin), etc ("SGLT2 inhibitors") must be held around the time of a procedure. This is not a comprehensive list of all of these drugs. Please review all of your medications and talk to your provider if you take any one of these. If you are not sure, ask your provider.   You may take your morning medications with sip of water, enough to get the pills down.  Make sure to TAKE YOUR ELIQUIS    Continue taking your anticoagulant (blood thinner): Apixaban (Eliquis).  You will need to continue this after your procedure until you are told by your provider that it is safe to stop.    LABS:   Come to [place] office between 6/16 - 6/20 for blood work.  You do NOT need to be fasting  FYI:  For your safety, and to allow us  to monitor your vital signs accurately during the surgery/procedure we request: If you have artificial nails, gel coating, SNS etc, please have those removed prior to your surgery/procedure. Not having the nail coverings /polish removed may result in cancellation or delay of your surgery/procedure.  Your support person will be asked to wait in the waiting room during your procedure.  It is OK to have someone drop you off and come back when you are ready to be discharged.  You cannot drive after the procedure and will need someone to drive you home.  Bring your insurance cards.  *Special Note: Every effort is made to have your procedure done on time. Occasionally there are emergencies that occur at the hospital that may cause delays. Please be patient if a delay does occur.       Cardiac Ablation Cardiac ablation is a procedure to destroy (ablate) some heart tissue that is sending bad signals. These bad signals cause problems in heart rhythm. The heart has many areas that make these signals. If there are problems in these areas, they  can make the heart beat in a way that is not normal. Destroying some tissues can help make the heart rhythm normal. Tell your doctor about: Any allergies you have. All medicines you are taking. These include vitamins, herbs, eye drops, creams, and over-the-counter medicines. Any problems you or family members have had with medicines that make you fall asleep (anesthetics). Any blood disorders you have. Any surgeries you have had. Any medical conditions you have, such as kidney failure. Whether you are pregnant or may be pregnant. What are the risks? This is a safe procedure. But problems may occur, including: Infection. Bruising and bleeding. Bleeding into the chest. Stroke or blood clots. Damage to nearby areas of your body. Allergies to medicines or dyes. The need for a pacemaker if the normal system is damaged. Failure of the procedure to treat the problem. What happens before the procedure? Medicines Ask your doctor about: Changing or stopping your normal medicines. This is important. Taking aspirin and ibuprofen. Do not take these medicines unless your doctor tells you to take them. Taking other  medicines, vitamins, herbs, and supplements. General instructions Follow instructions from your doctor about what you cannot eat or drink. Plan to have someone take you home from the hospital or clinic. If you will be going home right after the procedure, plan to have someone with you for 24 hours. Ask your doctor what steps will be taken to prevent infection. What happens during the procedure?  An IV tube will be put into one of your veins. You will be given a medicine to help you relax. The skin on your neck or groin will be numbed. A cut (incision) will be made in your neck or groin. A needle will be put through your cut and into a large vein. A tube (catheter) will be put into the needle. The tube will be moved to your heart. Dye may be put through the tube. This helps your  doctor see your heart. Small devices (electrodes) on the tube will send out signals. A type of energy will be used to destroy some heart tissue. The tube will be taken out. Pressure will be held on your cut. This helps stop bleeding. A bandage will be put over your cut. The exact procedure may vary among doctors and hospitals. What happens after the procedure? You will be watched until you leave the hospital or clinic. This includes checking your heart rate, breathing rate, oxygen, and blood pressure. Your cut will be watched for bleeding. You will need to lie still for a few hours. Do not drive for 24 hours or as long as your doctor tells you. Summary Cardiac ablation is a procedure to destroy some heart tissue. This is done to treat heart rhythm problems. Tell your doctor about any medical conditions you may have. Tell him or her about all medicines you are taking to treat them. This is a safe procedure. But problems may occur. These include infection, bruising, bleeding, and damage to nearby areas of your body. Follow what your doctor tells you about food and drink. You may also be told to change or stop some of your medicines. After the procedure, do not drive for 24 hours or as long as your doctor tells you. This information is not intended to replace advice given to you by your health care provider. Make sure you discuss any questions you have with your health care provider. Document Revised: 12/17/2021 Document Reviewed: 08/29/2019 Elsevier Patient Education  2023 Elsevier Inc.   Cardiac Ablation, Care After  This sheet gives you information about how to care for yourself after your procedure. Your health care provider may also give you more specific instructions. If you have problems or questions, contact your health care provider. What can I expect after the procedure? After the procedure, it is common to have: Bruising around your puncture site. Tenderness around your puncture  site. Skipped heartbeats. If you had an atrial fibrillation ablation, you may have atrial fibrillation during the first several months after your procedure.  Tiredness (fatigue).  Follow these instructions at home: Puncture site care  Follow instructions from your health care provider about how to take care of your puncture site. Make sure you: If present, leave stitches (sutures), skin glue, or adhesive strips in place. These skin closures may need to stay in place for up to 2 weeks. If adhesive strip edges start to loosen and curl up, you may trim the loose edges. Do not remove adhesive strips completely unless your health care provider tells you to do that. If a large square  bandage is present, this may be removed 24 hours after surgery.  Check your puncture site every day for signs of infection. Check for: Redness, swelling, or pain. Fluid or blood. If your puncture site starts to bleed, lie down on your back, apply firm pressure to the area, and contact your health care provider. Warmth. Pus or a bad smell. A pea or small marble sized lump at the site is normal and can take up to three months to resolve.  Driving Do not drive for at least 4 days after your procedure or however long your health care provider recommends. (Do not resume driving if you have previously been instructed not to drive for other health reasons.) Do not drive or use heavy machinery while taking prescription pain medicine. Activity Avoid activities that take a lot of effort for at least 7 days after your procedure. Do not lift anything that is heavier than 5 lb (4.5 kg) for one week.  No sexual activity for 1 week.  Return to your normal activities as told by your health care provider. Ask your health care provider what activities are safe for you. General instructions Take over-the-counter and prescription medicines only as told by your health care provider. Do not use any products that contain nicotine or  tobacco, such as cigarettes and e-cigarettes. If you need help quitting, ask your health care provider. You may shower after 24 hours, but Do not take baths, swim, or use a hot tub for 1 week.  Do not drink alcohol for 24 hours after your procedure. Keep all follow-up visits as told by your health care provider. This is important. Contact a health care provider if: You have redness, mild swelling, or pain around your puncture site. You have fluid or blood coming from your puncture site that stops after applying firm pressure to the area. Your puncture site feels warm to the touch. You have pus or a bad smell coming from your puncture site. You have a fever. You have chest pain or discomfort that spreads to your neck, jaw, or arm. You have chest pain that is worse with lying on your back or taking a deep breath. You are sweating a lot. You feel nauseous. You have a fast or irregular heartbeat. You have shortness of breath. You are dizzy or light-headed and feel the need to lie down. You have pain or numbness in the arm or leg closest to your puncture site. Get help right away if: Your puncture site suddenly swells. Your puncture site is bleeding and the bleeding does not stop after applying firm pressure to the area. These symptoms may represent a serious problem that is an emergency. Do not wait to see if the symptoms will go away. Get medical help right away. Call your local emergency services (911 in the [place] Do not drive yourself to the hospital. Summary After the procedure, it is normal to have bruising and tenderness at the puncture site in your groin, neck, or forearm. Check your puncture site every day for signs of infection. Get help right away if your puncture site is bleeding and the bleeding does not stop after applying firm pressure to the area. This is a medical emergency. This information is not intended to replace advice given to you by your health care provider. Make sure  you discuss any questions you have with your health care provider.

## 2024-03-14 ENCOUNTER — Ambulatory Visit: Admitting: Cardiology

## 2024-03-19 ENCOUNTER — Telehealth: Payer: Self-pay

## 2024-03-19 NOTE — Telephone Encounter (Signed)
 Pt viewed lab results on My Chart per Dr. Vanetta Shawl note. Routed to PCP.

## 2024-03-19 NOTE — Telephone Encounter (Signed)
 Left message on My Chart with normal lab results per Dr. Vanetta Shawl note. Routed to PCP.

## 2024-03-22 ENCOUNTER — Ambulatory Visit: Attending: Cardiology | Admitting: Cardiology

## 2024-03-22 ENCOUNTER — Encounter: Payer: Self-pay | Admitting: Cardiology

## 2024-03-22 ENCOUNTER — Telehealth: Payer: Self-pay | Admitting: Cardiology

## 2024-03-22 VITALS — BP 140/70 | HR 56 | Ht 64.0 in | Wt 211.4 lb

## 2024-03-22 DIAGNOSIS — E782 Mixed hyperlipidemia: Secondary | ICD-10-CM | POA: Diagnosis not present

## 2024-03-22 DIAGNOSIS — I4819 Other persistent atrial fibrillation: Secondary | ICD-10-CM

## 2024-03-22 DIAGNOSIS — I480 Paroxysmal atrial fibrillation: Secondary | ICD-10-CM

## 2024-03-22 DIAGNOSIS — I420 Dilated cardiomyopathy: Secondary | ICD-10-CM

## 2024-03-22 MED ORDER — FUROSEMIDE 20 MG PO TABS: 20.0000 mg | ORAL_TABLET | Freq: Every day | ORAL | 3 refills | Status: AC

## 2024-03-22 MED ORDER — METOPROLOL TARTRATE 25 MG PO TABS: 25.0000 mg | ORAL_TABLET | Freq: Two times a day (BID) | ORAL | 3 refills | Status: AC

## 2024-03-22 NOTE — Addendum Note (Signed)
 Addended by: Shawnee Dellen D on: 03/22/2024 02:03 PM   Modules accepted: Orders

## 2024-03-22 NOTE — Telephone Encounter (Signed)
 Pt c/o swelling/edema: STAT if pt has developed SOB within 24 hours  If swelling, where is the swelling located? Ankles   How much weight have you gained and in what time span? Not sure   Have you gained 2 pounds in a day or 5 pounds in a week? Not sure   Do you have a log of your daily weights (if so, list)? Did not log   Are you currently taking a fluid pill? No.   Are you currently SOB? no  Have you traveled recently in a car or plane for an extended period of time?     Pt also called to inform that per her [facility], her heart is back in rhythm. She states it shows she has had bradycardia episodes ranging between 48-62.

## 2024-03-22 NOTE — Progress Notes (Signed)
 Cardiology Office Note:    Date:  03/22/2024   ID:  Beth Soto, DOB [birth year], MRN [id number]  PCP:  Adrian Hopper, MD  Cardiologist:  Ralene Burger, MD    Referring MD: Adrian Hopper, MD   Chief Complaint  Patient presents with   Leg Swelling    And abdominal    History of Present Illness:    Beth Soto is a 68 y.o. female past medical history significant for paroxysmal atrial fibrillation, she did have the event cardioversion 1 shock 200 J converted to sinus rhythm but few weeks later she flipped back to atrial fibrillation, she was also identified to have cardiomyopathy ejection fraction 40 to 45%, comes today to months of follow-up she did see our EP team for consideration of ablation she is scheduled for September, she was given amiodarone  200 g twice daily yesterday morning she flipped back to sinus rhythm however felt hopeful she is bradycardic.  Also noticed having some swelling of lower extremities sometimes wheezing at night.  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; on INS for 1 year   Ventricular ectopy 08/20/2021    Past Surgical History:  Procedure Laterality Date   CARDIOVERSION N/A 02/15/2024   Procedure: CARDIOVERSION;  Surgeon: Jerryl Morin, DO;  Location: [facility] INVASIVE CV LAB;  Service: Cardiovascular;  Laterality: N/A;   COLONOSCOPY     HAMMER TOE SURGERY     JOINT REPLACEMENT     MELANOMA EXCISION      TOTAL HIP ARTHROPLASTY Right 04/2020   TRANSESOPHAGEAL ECHOCARDIOGRAM (CATH LAB) N/A 02/15/2024   Procedure: TRANSESOPHAGEAL ECHOCARDIOGRAM;  Surgeon: Jerryl Morin, DO;  Location: [facility] INVASIVE CV LAB;  Service: Cardiovascular;  Laterality: N/A;    Current Medications: Current Meds  Medication Sig   amiodarone  (PACERONE ) 200 MG tablet Take 2 tablets (400 mg total) TWICE a day for 2 weeks, then take 1 tablet (200 mg total) TWICE a day for 2 weeks, then take 1 tablet (200 mg total) ONCE a day thereafter   atorvastatin (LIPITOR) 20 MG tablet Take 20 mg by mouth at bedtime.   Calcium Carb-Cholecalciferol (CALCIUM 600 + D PO) Take 1 tablet by mouth daily. Unknown strength   clobetasol (TEMOVATE) 0.05 % external solution Apply 1 Application topically See admin instructions. Sat and Sun ONLY   diltiazem  (CARDIZEM  CD) 120 MG 24 hr capsule Take 1 capsule (120 mg total) by mouth daily.   ELIQUIS 5 MG TABS tablet Take 5 mg by mouth 2 (two) times daily.   levothyroxine (SYNTHROID) 150 MCG tablet Take 150 mcg by mouth daily before breakfast.   losartan  (COZAAR ) 25 MG tablet Take 1 tablet (25 mg total) by mouth daily.   metoprolol  tartrate (LOPRESSOR ) 50 MG tablet Take 75 mg by mouth 2 (two) times daily.   minoxidil (LONITEN) 2.5 MG tablet Take 1.25 mg by mouth daily.   tacrolimus (PROTOPIC) 0.1 %  ointment Apply 1 Application topically at bedtime.   Current Facility-Administered Medications for the 03/22/24 encounter (Office Visit) with Anaika Santillano J, MD  Medication   0.9 %  sodium chloride  infusion     Allergies:   Patient has no known allergies.   Social History   Socioeconomic History   Marital status: Widowed    Spouse name: Not on file   Number of children: 1   Years of education: Not on file   Highest education level: Not on file  Occupational History   Occupation: retired Forensic scientist  Tobacco Use   Smoking status: Never   Smokeless tobacco: Never  Vaping Use   Vaping  status: Never Used  Substance and Sexual Activity   Alcohol use: Yes    Comment: occassionally   Drug use: No   Sexual activity: Not on file  Other Topics Concern   Not on file  Social History Narrative   Not on file   Social Drivers of Health   Financial Resource Strain: Not on file  Food Insecurity: Not on file  Transportation Needs: Not on file  Physical Activity: Not on file  Stress: Not on file  Social Connections: Not on file     Family History: The patient's family history includes Atrial fibrillation in her father; Bladder Cancer in her brother; COPD in her brother; Colon polyps in her mother; Diabetes in her brother; Heart disease in her father and mother; Heart failure in her brother, father, and mother; Hepatitis C in her brother; Hypertension in her mother; Stroke in her father and mother. There is no history of Colon cancer, Esophageal cancer, Rectal cancer, or Stomach cancer. ROS:   Please see the history of present illness.    All 14 point review of systems negative except as described per history of present illness  EKGs/Labs/Other Studies Reviewed:    EKG Interpretation Date/Time:  Friday March 22 2024 13:22:30 EDT Ventricular Rate:  56 PR Interval:  216 QRS Duration:  102 QT Interval:  562 QTC Calculation: 542 R Axis:   -24  Text Interpretation: Sinus bradycardia with 1st degree A-V block Low voltage QRS Nonspecific T wave abnormality Abnormal ECG When compared with ECG of 28-Feb-2024 13:02, Sinus rhythm has replaced Atrial fibrillation Vent. rate has decreased BY  49 BPM QT has lengthened Confirmed by Ralene Burger [id number]) on 03/22/2024 1:36:02 PM    Recent Labs: 03/06/2024: BUN 27; Creatinine, Ser 0.82; Potassium 4.5; Sodium 139  Recent Lipid Panel No results found for: CHOL, TRIG, HDL, CHOLHDL, VLDL, LDLCALC, LDLDIRECT  Physical Exam:    VS:  BP (!) 140/70 (BP Location: Left Arm, Patient Position: Sitting)   Pulse (!) 56   Ht 5' 4  (1.626 m)   Wt 211 lb 6.4 oz (95.9 kg)   SpO2 95%   BMI 36.29 kg/m     Wt Readings from Last 3 Encounters:  03/22/24 211 lb 6.4 oz (95.9 kg)  03/11/24 206 lb 12.8 oz (93.8 kg)  02/28/24 203 lb 12.8 oz (92.4 kg)     GEN:  Well nourished, well developed in no acute distress HEENT: Normal NECK: No JVD; No carotid bruits LYMPHATICS: No lymphadenopathy CARDIAC: RRR, no murmurs, no rubs, no gallops RESPIRATORY:  Clear to auscultation without rales, wheezing or rhonchi  ABDOMEN: Soft, non-tender, non-distended MUSCULOSKELETAL:  No edema; No deformity  SKIN: Warm and dry LOWER EXTREMITIES: Minimal swelling NEUROLOGIC:  Alert and oriented x 3 PSYCHIATRIC:  Normal affect   ASSESSMENT:  1. Persistent atrial fibrillation (HCC)   2. Paroxysmal atrial fibrillation (HCC)   3. Dilated cardiomyopathy (HCC)   4. Mixed hyperlipidemia    PLAN:    In order of problems listed above:  Paroxysmal atrial fibrillation converted to sinus rhythm with amiodarone , will cut down metoprolol  to only 25 mg twice daily, continue Cardizem , continue amiodarone  as she does have a tapered dose of amiodarone .  Continue anticoagulation. Dilated cardiomyopathy I see him back promptly within the next few weeks to adjust her medications. Mixed dyslipidemia total cholesterol 152 LDL 66 HDL 68 good cholesterol from 01/30/2024 continue present management   Medication Adjustments/Labs and Tests Ordered: Current medicines are reviewed at length with the patient today.  Concerns regarding medicines are outlined above.  Orders Placed This Encounter  Procedures   EKG 12-Lead   Medication changes: No orders of the defined types were placed in this encounter.   Signed, Manfred Seed, MD, [facility] 03/22/2024 1:51 PM     [facility]

## 2024-03-22 NOTE — Telephone Encounter (Signed)
 Spoke with pt, she reports swelling in her ankles and abdomen that started Wednesday. She reports SOB with exertion in her home. She denies orthopnea, but does report some wheezing at night. She has a non-productive couch after eating. She is trying to watch her sodium intake and she is drinking plenty of water. Her kardia mobile states she is in rhythm. She is due for DCCV 04/04/24. Patient will see Dr Krasowski today at 1:20 pm.

## 2024-03-22 NOTE — Patient Instructions (Addendum)
 Medication Instructions:   Metoprolol  Tartrate 25mg  twice daily    Lasix 20mg  1 tablet daily   Lab Work: None Ordered If you have labs (blood work) drawn today and your tests are completely normal, you will receive your results only by: MyChart Message (if you have MyChart) OR A paper copy in the mail If you have any lab test that is abnormal or we need to change your treatment, we will call you to review the results.   Testing/Procedures: None Ordered   Follow-Up: At [facility], you and your health needs are our priority.  As part of our continuing mission to provide you with exceptional heart care, we have created designated Provider Care Teams.  These Care Teams include your primary Cardiologist (physician) and Advanced Practice Providers (APPs -  Physician Assistants and Nurse Practitioners) who all work together to provide you with the care you need, when you need it.  We recommend signing up for the patient portal called MyChart.  Sign up information is provided on this After Visit Summary.  MyChart is used to connect with patients for Virtual Visits (Telemedicine).  Patients are able to view lab/test results, encounter notes, upcoming appointments, etc.  Non-urgent messages can be sent to your provider as well.   To learn more about what you can do with MyChart, go to [email or web address].    Your next appointment:   Keep scheduled appt  The format for your next appointment:   In Person  Provider:   Ralene Burger, MD    Other Instructions NA

## 2024-03-25 ENCOUNTER — Encounter: Payer: Self-pay | Admitting: Cardiology

## 2024-03-25 MED ORDER — AMIODARONE HCL 200 MG PO TABS: ORAL_TABLET | ORAL | 3 refills | Status: DC

## 2024-03-29 ENCOUNTER — Encounter: Payer: Self-pay | Admitting: Cardiology

## 2024-04-03 ENCOUNTER — Encounter: Payer: Self-pay | Admitting: Cardiology

## 2024-04-03 ENCOUNTER — Encounter (HOSPITAL_COMMUNITY): Payer: Self-pay | Admitting: Certified Registered"

## 2024-04-03 NOTE — Progress Notes (Addendum)
 Called pt to review pre op instructions for Cardioversion.  Pt states that she is in NSR per [facility].  She has called Dr Carolyn office.  She is awaiting word from office.

## 2024-04-04 ENCOUNTER — Encounter (HOSPITAL_COMMUNITY): Admission: RE | Payer: Self-pay | Source: Home / Self Care

## 2024-04-04 ENCOUNTER — Ambulatory Visit (HOSPITAL_COMMUNITY): Admission: RE | Admit: 2024-04-04 | Source: Home / Self Care | Admitting: Cardiology

## 2024-04-04 DIAGNOSIS — I4819 Other persistent atrial fibrillation: Secondary | ICD-10-CM

## 2024-04-04 SURGERY — CARDIOVERSION (CATH LAB)
Anesthesia: General

## 2024-04-23 DIAGNOSIS — L0211 Cutaneous abscess of neck: Secondary | ICD-10-CM | POA: Diagnosis not present

## 2024-04-30 ENCOUNTER — Ambulatory Visit: Attending: Cardiology | Admitting: Cardiology

## 2024-04-30 ENCOUNTER — Encounter: Payer: Self-pay | Admitting: Cardiology

## 2024-04-30 VITALS — BP 126/80 | HR 63 | Ht 64.0 in | Wt 207.6 lb

## 2024-04-30 DIAGNOSIS — I420 Dilated cardiomyopathy: Secondary | ICD-10-CM | POA: Diagnosis not present

## 2024-04-30 DIAGNOSIS — I4819 Other persistent atrial fibrillation: Secondary | ICD-10-CM

## 2024-04-30 DIAGNOSIS — E782 Mixed hyperlipidemia: Secondary | ICD-10-CM | POA: Diagnosis not present

## 2024-04-30 DIAGNOSIS — I493 Ventricular premature depolarization: Secondary | ICD-10-CM

## 2024-04-30 DIAGNOSIS — I480 Paroxysmal atrial fibrillation: Secondary | ICD-10-CM | POA: Diagnosis not present

## 2024-04-30 NOTE — Progress Notes (Unsigned)
 Cardiology Office Note:    Date:  04/30/2024   ID:  Beth Soto, DOB June 04, 1956, MRN [id number]  PCP:  Keren Vicenta FORBES, MD  Cardiologist:  Lamar Fitch, MD    Referring MD: Keren Vicenta FORBES, MD   Chief Complaint  Patient presents with   Follow-up    History of Present Illness:    Beth Soto is a 68 y.o. female past medical history significant for paroxysmal atrial fibrillation, she did have cardioversion 1 time with 1 shock of 200 J but very quickly she felt back to atrial fibrillation.  After that she was put on amiodarone  and now she went back to normal sinus rhythm and maintaining.  Additionally she was cardiomyopathy with ejection fraction 4045%.  She is scheduled to have ablation of atrial fibrillation in September comes today 2 months for follow-up overall she is doing great she is in normal rhythm legs are less swollen she has more energy doing well clearly she likes to be in sinus rhythm.  She does have some epidermal cyst on the neck that need to be removed this Thursday.  She will be able to hold her anticoagulation for 2 days and after that restart her prescription as feasible from surgical point review.  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; on INS for 1 year   Ventricular ectopy 08/20/2021    Past Surgical History:  Procedure Laterality Date   CARDIOVERSION N/A 02/15/2024   Procedure: CARDIOVERSION;  Surgeon: Sheena Pugh, DO;  Location: [facility] INVASIVE CV LAB;  Service: Cardiovascular;  Laterality: N/A;   COLONOSCOPY     HAMMER TOE SURGERY     JOINT REPLACEMENT     MELANOMA EXCISION     TOTAL HIP ARTHROPLASTY Right 04/2020   TRANSESOPHAGEAL ECHOCARDIOGRAM (CATH LAB) N/A 02/15/2024   Procedure: TRANSESOPHAGEAL ECHOCARDIOGRAM;  Surgeon: Sheena Pugh, DO;  Location: [facility] INVASIVE CV LAB;  Service: Cardiovascular;  Laterality: N/A;    Current Medications: Current Meds  Medication Sig   amiodarone  (PACERONE ) 200 MG tablet Take 2 tablets (400 mg total) TWICE a day for 2 weeks, then take 1 tablet (200 mg total) TWICE a day for 2 weeks, then take 1 tablet (200 mg total) ONCE a day thereafter (Patient taking differently: Take 200 mg by mouth 2 (two) times daily. Take 2 tablets (400 mg total) TWICE a day for 2 weeks, then take 1 tablet (200 mg total) TWICE a day for 2 weeks, then take 1 tablet (200 mg total) ONCE a day thereafter)   atorvastatin (LIPITOR) 20 MG tablet Take 20 mg by mouth at bedtime.   Calcium Carb-Cholecalciferol (CALCIUM 600 + D PO) Take 1 tablet by mouth daily. Unknown strength   clindamycin (CLEOCIN) 300 MG capsule Take 300 mg by mouth 3 (three) times daily.   clobetasol (TEMOVATE) 0.05 % external solution Apply 1 Application topically See admin instructions. Friday, Saturday and  Sunday   diltiazem  (CARDIZEM  CD) 120 MG 24 hr capsule Take 1 capsule (120 mg total) by mouth daily.   ELIQUIS 5 MG TABS tablet Take 5 mg by mouth 2 (two) times daily.   furosemide  (LASIX ) 20 MG tablet Take 1 tablet (20 mg total) by mouth daily.   levothyroxine (SYNTHROID) 150 MCG tablet Take 150 mcg by mouth daily before breakfast.   losartan  (COZAAR ) 25 MG tablet Take 1 tablet (25 mg total) by mouth daily.   metoprolol  tartrate (LOPRESSOR ) 25 MG tablet Take 1 tablet (25 mg total) by mouth 2 (two) times daily. (Patient taking differently: Take 25 mg by mouth daily.)   minoxidil (LONITEN) 2.5 MG tablet Take 1.25 mg by mouth  daily.   tacrolimus (PROTOPIC) 0.1 % ointment Apply 1 Application topically at bedtime.   Current Facility-Administered Medications for the 04/30/24 encounter (Office Visit) with Jelesa Mangini J, MD  Medication   0.9 %  sodium chloride  infusion     Allergies:   Patient has no known allergies.   Social History   Socioeconomic History   Marital status: Widowed    Spouse name: Not on file   Number of children: 1   Years of education: Not on file   Highest education level: Not on file  Occupational History   Occupation: retired Forensic scientist  Tobacco Use   Smoking status: Never   Smokeless tobacco: Never  Vaping Use   Vaping status: Never Used  Substance and Sexual Activity   Alcohol use: Yes    Comment: occassionally   Drug use: No   Sexual activity: Not on file  Other Topics Concern   Not on file  Social History Narrative   Not on file   Social Drivers of Health   Financial Resource Strain: Not on file  Food Insecurity: Not on file  Transportation Needs: Not on file  Physical Activity: Not on file  Stress: Not on file  Social Connections: Not on file     Family History: The patient's family history includes Atrial fibrillation in her father; Bladder Cancer in her brother; COPD in her brother; Colon polyps in her mother; Diabetes in her brother; Heart disease in her father and mother; Heart failure in her brother, father, and mother; Hepatitis C in her brother; Hypertension in her mother; Stroke in her father and mother. There is no history of Colon cancer, Esophageal cancer, Rectal cancer, or Stomach cancer. ROS:   Please see the history of present illness.    All 14 point review of systems negative except as described per history of present illness  EKGs/Labs/Other Studies Reviewed:         Recent Labs: 03/06/2024: BUN 27; Creatinine, Ser 0.82; Potassium 4.5; Sodium 139  Recent Lipid Panel No results found for: CHOL, TRIG, HDL, CHOLHDL,  VLDL, LDLCALC, LDLDIRECT  Physical Exam:    VS:  BP 126/80 (BP Location: Left Arm, Patient Position: Sitting)   Pulse 63   Ht 5' 4 (1.626 m)   Wt 207 lb 9.6 oz (94.2 kg)   SpO2 95%   BMI 35.63 kg/m     Wt Readings from Last 3 Encounters:  04/30/24 207 lb 9.6 oz (94.2 kg)  03/22/24 211 lb 6.4 oz (95.9 kg)  03/11/24 206 lb 12.8 oz (93.8 kg)     GEN:  Well nourished, well developed in no acute distress HEENT: Normal NECK: No JVD; No carotid bruits LYMPHATICS: No lymphadenopathy CARDIAC: RRR, no murmurs, no rubs, no gallops RESPIRATORY:  Clear to auscultation without rales, wheezing or rhonchi  ABDOMEN: Soft, non-tender, non-distended MUSCULOSKELETAL:  No edema; No deformity  SKIN: Warm and dry LOWER EXTREMITIES: no swelling NEUROLOGIC:  Alert and oriented x 3 PSYCHIATRIC:  Normal affect   ASSESSMENT:    1. Persistent atrial fibrillation (HCC)   2. Paroxysmal atrial fibrillation (HCC)   3. Dilated cardiomyopathy (HCC)   4. Ventricular ectopy   5. Mixed hyperlipidemia    PLAN:    In order of problems listed above:  Paroxysmal atrial fibrillation.  Maintain amiodarone  200 mg daily, Eliquis 5 mg twice a day.  She will be able to hold Eliquis for 2 days before surgical procedure on her neck. History of cardiomyopathy on guideline directed medical therapy will reevaluate this after ablation and longer period of time of maintaining sinus rhythm. History of ventricular tachycardia no dizziness no passing out continue amiodarone . Mixed dyslipidemia I did review K PN from April LDL 66 HDL 68 continue present management   Medication Adjustments/Labs and Tests Ordered: Current medicines are reviewed at length with the patient today.  Concerns regarding medicines are outlined above.  Orders Placed This Encounter  Procedures   EKG 12-Lead   Medication changes: No orders of the defined types were placed in this encounter.   Signed, Lamar DOROTHA Fitch, MD,  [facility] 04/30/2024 1:04 PM     [facility]

## 2024-04-30 NOTE — Patient Instructions (Addendum)
 Medication Instructions:   May hold Eliquis 48 hours before surgical procedure   Lab Work: None Ordered If you have labs (blood work) drawn today and your tests are completely normal, you will receive your results only by: MyChart Message (if you have MyChart) OR A paper copy in the mail If you have any lab test that is abnormal or we need to change your treatment, we will call you to review the results.   Testing/Procedures: None Ordered   Follow-Up: At [facility], you and your health needs are our priority.  As part of our continuing mission to provide you with exceptional heart care, we have created designated Provider Care Teams.  These Care Teams include your primary Cardiologist (physician) and Advanced Practice Providers (APPs -  Physician Assistants and Nurse Practitioners) who all work together to provide you with the care you need, when you need it.  We recommend signing up for the patient portal called MyChart.  Sign up information is provided on this After Visit Summary.  MyChart is used to connect with patients for Virtual Visits (Telemedicine).  Patients are able to view lab/test results, encounter notes, upcoming appointments, etc.  Non-urgent messages can be sent to your provider as well.   To learn more about what you can do with MyChart, go to [email or web address].    Your next appointment:   3 month(s)  The format for your next appointment:   In Person  Provider:   Lamar Fitch, MD    Other Instructions NA

## 2024-05-02 DIAGNOSIS — L0211 Cutaneous abscess of neck: Secondary | ICD-10-CM | POA: Insufficient documentation

## 2024-05-08 ENCOUNTER — Encounter: Payer: Self-pay | Admitting: Cardiology

## 2024-05-08 DIAGNOSIS — L0211 Cutaneous abscess of neck: Secondary | ICD-10-CM | POA: Diagnosis not present

## 2024-05-08 DIAGNOSIS — Z09 Encounter for follow-up examination after completed treatment for conditions other than malignant neoplasm: Secondary | ICD-10-CM | POA: Diagnosis not present

## 2024-05-22 ENCOUNTER — Ambulatory Visit (HOSPITAL_COMMUNITY)
Admission: RE | Admit: 2024-05-22 | Discharge: 2024-05-22 | Disposition: A | Discharge status: Home / Self Care | Source: Ambulatory Visit | Attending: Cardiology | Admitting: Cardiology

## 2024-05-22 DIAGNOSIS — Z01812 Encounter for preprocedural laboratory examination: Secondary | ICD-10-CM | POA: Diagnosis not present

## 2024-05-22 DIAGNOSIS — I4819 Other persistent atrial fibrillation: Secondary | ICD-10-CM | POA: Insufficient documentation

## 2024-05-22 DIAGNOSIS — I700 Atherosclerosis of aorta: Secondary | ICD-10-CM | POA: Insufficient documentation

## 2024-05-22 MED ORDER — IOHEXOL 350 MG/ML SOLN
80.0000 mL | Freq: Once | INTRAVENOUS | Status: AC | PRN
  Administered 2024-05-22 (×2): 80 mL via INTRAVENOUS

## 2024-05-23 ENCOUNTER — Ambulatory Visit: Payer: Self-pay | Admitting: *Deleted

## 2024-05-23 LAB — BASIC METABOLIC PANEL WITH GFR
BUN/Creatinine Ratio: 24 (ref 12–28)
BUN: 23 mg/dL (ref 8–27)
CO2: 22 mmol/L (ref 20–29)
Calcium: 9.5 mg/dL (ref 8.7–10.3)
Chloride: 100 mmol/L (ref 96–106)
Creatinine, Ser: 0.97 mg/dL (ref 0.57–1.00)
Glucose: 89 mg/dL (ref 70–99)
Potassium: 4.4 mmol/L (ref 3.5–5.2)
Sodium: 138 mmol/L (ref 134–144)
eGFR: 63 mL/min/1.73 (ref >59)

## 2024-05-23 LAB — CBC
Hematocrit: 40.9 % (ref 34.0–46.6)
Hemoglobin: 13.8 g/dL (ref 11.1–15.9)
MCH: 32 pg (ref 26.6–33.0)
MCHC: 33.7 g/dL (ref 31.5–35.7)
MCV: 95 fL (ref 79–97)
Platelets: 379 x10E3/uL (ref 150–450)
RBC: 4.31 x10E6/uL (ref 3.77–5.28)
RDW: 14.4 % (ref 11.7–15.4)
WBC: 8.3 x10E3/uL (ref 3.4–10.8)

## 2024-05-29 ENCOUNTER — Other Ambulatory Visit

## 2024-06-05 ENCOUNTER — Encounter (HOSPITAL_COMMUNITY): Payer: Self-pay

## 2024-06-07 DIAGNOSIS — Z1231 Encounter for screening mammogram for malignant neoplasm of breast: Secondary | ICD-10-CM | POA: Diagnosis not present

## 2024-06-11 NOTE — Pre-Procedure Instructions (Signed)
 Attempted to call patient regarding procedure instructions.  Left voicemail on the following items: Arrival time 0615 Nothing to eat or drink after midnight No meds AM of procedure Responsible person to drive you home and stay with you for 24 hrs  Have you missed any doses of anti-coagulant Eliquis- should be taken twice a day, if you have missed any doses please let us  know.  Don't take dose morning of procedure.

## 2024-06-12 ENCOUNTER — Other Ambulatory Visit: Payer: Self-pay

## 2024-06-12 ENCOUNTER — Ambulatory Visit (HOSPITAL_COMMUNITY): Payer: Self-pay | Admitting: Anesthesiology

## 2024-06-12 ENCOUNTER — Ambulatory Visit (HOSPITAL_COMMUNITY)
Admission: RE | Admit: 2024-06-12 | Discharge: 2024-06-12 | Disposition: A | Discharge status: Home / Self Care | Attending: Cardiology | Admitting: Cardiology

## 2024-06-12 ENCOUNTER — Encounter (HOSPITAL_COMMUNITY): Payer: Self-pay | Admitting: Cardiology

## 2024-06-12 ENCOUNTER — Ambulatory Visit (HOSPITAL_BASED_OUTPATIENT_CLINIC_OR_DEPARTMENT_OTHER): Payer: Self-pay | Admitting: Anesthesiology

## 2024-06-12 ENCOUNTER — Encounter (HOSPITAL_COMMUNITY)
Admission: RE | Disposition: A | Discharge status: Home / Self Care | Payer: Self-pay | Source: Home / Self Care | Attending: Cardiology

## 2024-06-12 DIAGNOSIS — E785 Hyperlipidemia, unspecified: Secondary | ICD-10-CM | POA: Diagnosis not present

## 2024-06-12 DIAGNOSIS — E039 Hypothyroidism, unspecified: Secondary | ICD-10-CM | POA: Diagnosis not present

## 2024-06-12 DIAGNOSIS — I4891 Unspecified atrial fibrillation: Secondary | ICD-10-CM | POA: Diagnosis not present

## 2024-06-12 DIAGNOSIS — I420 Dilated cardiomyopathy: Secondary | ICD-10-CM

## 2024-06-12 DIAGNOSIS — I4819 Other persistent atrial fibrillation: Secondary | ICD-10-CM

## 2024-06-12 DIAGNOSIS — Z7901 Long term (current) use of anticoagulants: Secondary | ICD-10-CM | POA: Diagnosis not present

## 2024-06-12 HISTORY — PX: ATRIAL FIBRILLATION ABLATION: EP1191

## 2024-06-12 MED ORDER — PROTAMINE SULFATE 10 MG/ML IV SOLN
INTRAVENOUS | Status: DC | PRN
Start: 2024-06-12 — End: 2024-06-12
  Administered 2024-06-12: 40 mg via INTRAVENOUS

## 2024-06-12 MED ORDER — FENTANYL CITRATE (PF) 250 MCG/5ML IJ SOLN
INTRAMUSCULAR | Status: DC | PRN
  Administered 2024-06-12 (×2): 50 ug via INTRAVENOUS

## 2024-06-12 MED ORDER — FENTANYL CITRATE (PF) 100 MCG/2ML IJ SOLN: 25.0000 ug | INTRAMUSCULAR | Status: DC | PRN

## 2024-06-12 MED ORDER — OXYCODONE HCL 5 MG/5ML PO SOLN: 5.0000 mg | Freq: Once | ORAL | Status: DC | PRN

## 2024-06-12 MED ORDER — AMISULPRIDE (ANTIEMETIC) 5 MG/2ML IV SOLN: 10.0000 mg | Freq: Once | INTRAVENOUS | Status: DC | PRN

## 2024-06-12 MED ORDER — ATROPINE SULFATE 1 MG/ML IV SOLN
INTRAVENOUS | Status: DC | PRN
  Administered 2024-06-12: 1 mg via INTRAVENOUS

## 2024-06-12 MED ORDER — PHENYLEPHRINE 80 MCG/ML (10ML) SYRINGE FOR IV PUSH (FOR BLOOD PRESSURE SUPPORT)
PREFILLED_SYRINGE | INTRAVENOUS | Status: DC | PRN
  Administered 2024-06-12 (×2): 80 ug via INTRAVENOUS

## 2024-06-12 MED ORDER — PHENYLEPHRINE HCL-NACL 20-0.9 MG/250ML-% IV SOLN
INTRAVENOUS | Status: DC | PRN
  Administered 2024-06-12: 25 ug/min via INTRAVENOUS

## 2024-06-12 MED ORDER — ACETAMINOPHEN 325 MG PO TABS: 650.0000 mg | ORAL_TABLET | ORAL | Status: DC | PRN

## 2024-06-12 MED ORDER — HEPARIN (PORCINE) IN NACL 1000-0.9 UT/500ML-% IV SOLN
INTRAVENOUS | Status: DC | PRN
Start: 2024-06-12 — End: 2024-06-12
  Administered 2024-06-12 (×3): 500 mL

## 2024-06-12 MED ORDER — SUGAMMADEX SODIUM 200 MG/2ML IV SOLN
INTRAVENOUS | Status: DC | PRN
  Administered 2024-06-12: 200 mg via INTRAVENOUS

## 2024-06-12 MED ORDER — OXYCODONE HCL 5 MG PO TABS: 5.0000 mg | ORAL_TABLET | Freq: Once | ORAL | Status: DC | PRN

## 2024-06-12 MED ORDER — ONDANSETRON HCL 4 MG/2ML IJ SOLN: 4.0000 mg | Freq: Four times a day (QID) | INTRAMUSCULAR | Status: DC | PRN

## 2024-06-12 MED ORDER — ROCURONIUM BROMIDE 10 MG/ML (PF) SYRINGE
PREFILLED_SYRINGE | INTRAVENOUS | Status: DC | PRN
  Administered 2024-06-12: 50 mg via INTRAVENOUS

## 2024-06-12 MED ORDER — SODIUM CHLORIDE 0.9 % IV SOLN: INTRAVENOUS | Status: DC | PRN

## 2024-06-12 MED ORDER — SODIUM CHLORIDE 0.9 % IV SOLN: INTRAVENOUS | Status: DC

## 2024-06-12 MED ORDER — HEPARIN SODIUM (PORCINE) 1000 UNIT/ML IJ SOLN
INTRAMUSCULAR | Status: DC | PRN
  Administered 2024-06-12: 4000 [IU] via INTRAVENOUS
  Administered 2024-06-12: 12000 [IU] via INTRAVENOUS

## 2024-06-12 MED ORDER — PROPOFOL 10 MG/ML IV BOLUS
INTRAVENOUS | Status: DC | PRN
  Administered 2024-06-12: 120 mg via INTRAVENOUS

## 2024-06-12 MED ORDER — SODIUM CHLORIDE 0.9% FLUSH: 3.0000 mL | INTRAVENOUS | Status: DC | PRN

## 2024-06-12 MED ORDER — DEXAMETHASONE SODIUM PHOSPHATE 10 MG/ML IJ SOLN
INTRAMUSCULAR | Status: DC | PRN
  Administered 2024-06-12: 10 mg via INTRAVENOUS

## 2024-06-12 MED ORDER — SODIUM CHLORIDE 0.9 % IV SOLN: 250.0000 mL | INTRAVENOUS | Status: DC | PRN

## 2024-06-12 MED ORDER — LIDOCAINE 2% (20 MG/ML) 5 ML SYRINGE
INTRAMUSCULAR | Status: DC | PRN
  Administered 2024-06-12: 100 mg via INTRAVENOUS

## 2024-06-12 MED ORDER — ONDANSETRON HCL 4 MG/2ML IJ SOLN
INTRAMUSCULAR | Status: DC | PRN
  Administered 2024-06-12: 4 mg via INTRAVENOUS

## 2024-06-12 MED ORDER — FENTANYL CITRATE (PF) 100 MCG/2ML IJ SOLN
INTRAMUSCULAR | Status: AC
  Filled 2024-06-12: qty 2

## 2024-06-12 NOTE — Progress Notes (Signed)
 Discharge instructions reviewed with patient and daughter rosina at bedside. Denies questions or concerns. PT ambulated to the bathroom, where she was able to void without difficulty. After ambulation no S/S of of complications at incision site. Pt escorted from the unit to personal vehicle.

## 2024-06-12 NOTE — Anesthesia Preprocedure Evaluation (Addendum)
 Anesthesia Evaluation  Patient identified by MRN, date of birth, ID band Patient awake    Reviewed: Allergy & Precautions, NPO status , Patient's Chart, lab work & pertinent test results, reviewed documented beta blocker date and time   Airway Mallampati: II  TM Distance: >3 FB Neck ROM: Full    Dental no notable dental hx. (+) Teeth Intact, Dental Advisory Given   Pulmonary neg pulmonary ROS   Pulmonary exam normal breath sounds clear to auscultation       Cardiovascular Normal cardiovascular exam+ dysrhythmias (eliquis) Atrial Fibrillation and Ventricular Tachycardia  Rhythm:Irregular Rate:Normal  TEE 02/2024 1. Left ventricular ejection fraction, by estimation, is 45 to 50%. The  left ventricle has mildly decreased function. The left ventricle  demonstrates global hypokinesis.   2. Right ventricular systolic function is normal. The right ventricular  size is normal.   3. No left atrial/left atrial appendage thrombus was detected. The LAA  emptying velocity was 92 cm/s.   4. There is a small ( 0.41 cm x 0.31 cm) mass on the interatrial septum  on the left atrial side.   5. The mitral valve is normal in structure. Mild mitral valve  regurgitation. No evidence of mitral stenosis.   6. Tricuspid valve regurgitation is mild to moderate.   7. The aortic valve is normal in structure. Aortic valve regurgitation is  not visualized. No aortic stenosis is present.   8. The inferior vena cava is normal in size with greater than 50%  respiratory variability, suggesting right atrial pressure of 3 mmHg.     Neuro/Psych negative neurological ROS  negative psych ROS   GI/Hepatic Neg liver ROS, PUD,,,  Endo/Other  Hypothyroidism    Renal/GU negative Renal ROS  negative genitourinary   Musculoskeletal  (+) Arthritis ,    Abdominal   Peds  Hematology negative hematology ROS (+)   Anesthesia Other Findings    Reproductive/Obstetrics                              Anesthesia Physical Anesthesia Plan  ASA: 3  Anesthesia Plan: General   Post-op Pain Management: Minimal or no pain anticipated   Induction: Intravenous  PONV Risk Score and Plan: 3 and Dexamethasone , Ondansetron  and Treatment may vary due to age or medical condition  Airway Management Planned: Oral ETT  Additional Equipment:   Intra-op Plan:   Post-operative Plan: Extubation in OR  Informed Consent: I have reviewed the patients History and Physical, chart, labs and discussed the procedure including the risks, benefits and alternatives for the proposed anesthesia with the patient or authorized representative who has indicated his/her understanding and acceptance.     Dental advisory given  Plan Discussed with: CRNA  Anesthesia Plan Comments:         Anesthesia Quick Evaluation

## 2024-06-12 NOTE — H&P (Signed)
  Electrophysiology Office Note:   Date:  06/12/2024  ID:  FRANKLIN CLAPSADDLE, DOB [birth year], MRN [id number]  Primary Cardiologist: Lamar Fitch, MD Primary Heart Failure: None Electrophysiologist: Amanada Philbrick Gladis Norton, MD      History of Present Illness:   Beth Soto is a 68 y.o. female with h/o PVCs, atrial fibrillation seen today for  for Electrophysiology evaluation of atrial fibrillation at the request of Lamar Fitch.    Today, denies symptoms of palpitations, chest pain, dyspnea, orthopnea, PND, lower extremity edema, claudication, dizziness, presyncope, syncope, bleeding, or neurologic sequela. The patient is tolerating medications without difficulties. Plan ablation today.   EP Information / Studies Reviewed:    EKG is not ordered today. EKG from 02/28/2024 reviewed which showed atrial fibrillation        Risk Assessment/Calculations:    CHA2DS2-VASc Score = 3   This indicates a 3.2% annual risk of stroke. The patient's score is based upon:         Physical Exam:   VS:  BP (!) 169/71   Pulse (!) 58   Temp 97.7 F (36.5 C) (Oral)   Resp 18   Ht 5' 4 (1.626 m)   Wt 90.7 kg   SpO2 98%   BMI 34.33 kg/m    Wt Readings from Last 3 Encounters:  06/12/24 90.7 kg  04/30/24 94.2 kg  03/22/24 95.9 kg    GEN: Well nourished, well developed in no acute distress NECK: No JVD; No carotid bruits CARDIAC: Regular rate and rhythm, no murmurs, rubs, gallops RESPIRATORY:  Clear to auscultation without rales, wheezing or rhonchi  ABDOMEN: Soft, non-tender, non-distended EXTREMITIES:  No edema; No deformity    ASSESSMENT AND PLAN:    1.  Persistent atrial fibrillation:Erendira E Torok has presented today for surgery, with the diagnosis of AF.  The various methods of treatment have been discussed with the patient and family. After consideration of risks, benefits and other options for treatment, the patient has consented to  Procedure(s): Catheter ablation as a surgical  intervention .  Risks include but not limited to complete heart block, stroke, esophageal damage, nerve damage, bleeding, vascular damage, tamponade, perforation, MI, and death. The patient's history has been reviewed, patient examined, no change in status, stable for surgery.  I have reviewed the patient's chart and labs.  Questions were answered to the patient's satisfaction.    Patrice Matthew Norton, MD 06/12/2024 7:19 AM

## 2024-06-12 NOTE — Progress Notes (Signed)
 Upon handoff and site check to Short stay unit nurse, Kate, scant drainage on bilateral groin sites was noted and both sites were marked by this nurse. Maurice, RN was called in for 2nd opinion regarding firm area felt upon palpation above groin sites. After all 3 nurse in room palpated the area in question, it was agreed that it was not a hematoma and was normal for patient's anatomy.

## 2024-06-12 NOTE — Anesthesia Procedure Notes (Signed)
 Procedure Name: Intubation Date/Time: 06/12/2024 8:45 AM  Performed by: Arvell Edsel HERO, CRNAPre-anesthesia Checklist: Patient identified, Emergency Drugs available, Patient being monitored, Timeout performed and Suction available Patient Re-evaluated:Patient Re-evaluated prior to induction Oxygen Delivery Method: Circle system utilized Preoxygenation: Pre-oxygenation with 100% oxygen Induction Type: IV induction Ventilation: Mask ventilation without difficulty Laryngoscope Size: Mac and 3 Grade View: Grade I Tube size: 7.0 mm Number of attempts: 1 Airway Equipment and Method: Patient positioned with wedge pillow and Stylet Placement Confirmation: ETT inserted through vocal cords under direct vision, positive ETCO2, CO2 detector and breath sounds checked- equal and bilateral Secured at: 22 cm Tube secured with: Tape

## 2024-06-12 NOTE — Discharge Instructions (Signed)
 Cardiac Ablation, Care After  This sheet gives you information about how to care for yourself after your procedure. Your health care provider may also give you more specific instructions. If you have problems or questions, contact your health care provider. What can I expect after the procedure? After the procedure, it is common to have: Bruising around your puncture site. Tenderness around your puncture site. Skipped heartbeats. If you had an atrial fibrillation ablation, you may have atrial fibrillation during the first several months after your procedure.  Tiredness (fatigue).  Follow these instructions at home: Puncture site care  Follow instructions from your health care provider about how to take care of your puncture site. Make sure you: If present, leave stitches (sutures), skin glue, or adhesive strips in place. These skin closures may need to stay in place for up to 2 weeks. If adhesive strip edges start to loosen and curl up, you may trim the loose edges. Do not remove adhesive strips completely unless your health care provider tells you to do that. If a large square bandage is present, this may be removed 24 hours after surgery.  Check your puncture site every day for signs of infection. Check for: Redness, swelling, or pain. Fluid or blood. If your puncture site starts to bleed, lie down on your back, apply firm pressure to the area, and contact your health care provider. Warmth. Pus or a bad smell. A pea or marble sized lump/knot at the site is normal and can take up to three months to resolve.  Driving Do not drive for at least 4 days after your procedure or however long your health care provider recommends. (Do not resume driving if you have previously been instructed not to drive for other health reasons.) Do not drive or use heavy machinery while taking prescription pain medicine. Activity Avoid activities that take a lot of effort for at least 7 days after your  procedure. Do not lift anything that is heavier than 5 lb (4.5 kg) for one week.  No sexual activity for 1 week.  Return to your normal activities as told by your health care provider. Ask your health care provider what activities are safe for you. General instructions Take over-the-counter and prescription medicines only as told by your health care provider. Do not use any products that contain nicotine or tobacco, such as cigarettes and e-cigarettes. If you need help quitting, ask your health care provider. You may shower after 24 hours, but Do not take baths, swim, or use a hot tub for 1 week.  Do not drink alcohol for 24 hours after your procedure. Keep all follow-up visits as told by your health care provider. This is important. Contact a health care provider if: You have redness, mild swelling, or pain around your puncture site. You have fluid or blood coming from your puncture site that stops after applying firm pressure to the area. Your puncture site feels warm to the touch. You have pus or a bad smell coming from your puncture site. You have a fever. You have chest pain or discomfort that spreads to your neck, jaw, or arm. You have chest pain that is worse with lying on your back or taking a deep breath. You are sweating a lot. You feel nauseous. You have a fast or irregular heartbeat. You have shortness of breath. You are dizzy or light-headed and feel the need to lie down. You have pain or numbness in the arm or leg closest to your puncture site.  Get help right away if: Your puncture site suddenly swells. Your puncture site is bleeding and the bleeding does not stop after applying firm pressure to the area. These symptoms may represent a serious problem that is an emergency. Do not wait to see if the symptoms will go away. Get medical help right away. Call your local emergency services (911 in the [place] Do not drive yourself to the hospital. Summary After the procedure, it  is normal to have bruising and tenderness at the puncture site in your groin, neck, or forearm. Check your puncture site every day for signs of infection. Get help right away if your puncture site is bleeding and the bleeding does not stop after applying firm pressure to the area. This is a medical emergency. This information is not intended to replace advice given to you by your health care provider. Make sure you discuss any questions you have with your health care provider.  Femoral Site Care This sheet gives you information about how to care for yourself after your procedure. Your health care provider may also give you more specific instructions. If you have problems or questions, contact your health care provider. What can I expect after the procedure?  After the procedure, it is common to have: Bruising that usually fades within 1-2 weeks. Tenderness at the site. Follow these instructions at home: Wound care Follow instructions from your health care provider about how to take care of your insertion site. Make sure you: Wash your hands with soap and water before you change your bandage (dressing). If soap and water are not available, use hand sanitizer. Remove your dressing as told by your health care provider. In 24 hours Do not take baths, swim, or use a hot tub until your health care provider approves. You may shower 24-48 hours after the procedure or as told by your health care provider. Gently wash the site with plain soap and water. Pat the area dry with a clean towel. Do not rub the site. This may cause bleeding. Do not apply powder or lotion to the site. Keep the site clean and dry. Check your femoral site every day for signs of infection. Check for: Redness, swelling, or pain. Fluid or blood. Warmth. Pus or a bad smell. Activity For the first 2-3 days after your procedure, or as long as directed: Avoid climbing stairs as much as possible. Do not squat. Do not lift  anything that is heavier than 10 lb (4.5 kg), or the limit that you are told, until your health care provider says that it is safe. For 5 days Rest as directed. Avoid sitting for a long time without moving. Get up to take short walks every 1-2 hours. Do not drive for 24 hours if you were given a medicine to help you relax (sedative). General instructions Take over-the-counter and prescription medicines only as told by your health care provider. Keep all follow-up visits as told by your health care provider. This is important. Contact a health care provider if you have: A fever or chills. You have redness, swelling, or pain around your insertion site. Get help right away if: The catheter insertion area swells very fast. You pass out. You suddenly start to sweat or your skin gets clammy. The catheter insertion area is bleeding, and the bleeding does not stop when you hold steady pressure on the area. The area near or just beyond the catheter insertion site becomes pale, cool, tingly, or numb. These symptoms may represent a serious  problem that is an emergency. Do not wait to see if the symptoms will go away. Get medical help right away. Call your local emergency services (911 in the [place] Do not drive yourself to the hospital. Summary After the procedure, it is common to have bruising that usually fades within 1-2 weeks. Check your femoral site every day for signs of infection. Do not lift anything that is heavier than 10 lb (4.5 kg), or the limit that you are told, until your health care provider says that it is safe. This information is not intended to replace advice given to you by your health care provider. Make sure you discuss any questions you have with your health care provider. Document Revised: 10/09/2017 Document Reviewed: 10/09/2017 Elsevier Patient Education  2020 [employer].

## 2024-06-12 NOTE — Anesthesia Postprocedure Evaluation (Signed)
 Anesthesia Post Note  Patient: Beth Soto  Procedure(s) Performed: ATRIAL FIBRILLATION ABLATION     Patient location during evaluation: Cath Lab Anesthesia Type: General Level of consciousness: awake and alert Pain management: pain level controlled Vital Signs Assessment: post-procedure vital signs reviewed and stable Respiratory status: spontaneous breathing, nonlabored ventilation, respiratory function stable and patient connected to nasal cannula oxygen Cardiovascular status: blood pressure returned to baseline and stable Postop Assessment: no apparent nausea or vomiting Anesthetic complications: no   There were no known notable events for this encounter.  Last Vitals:  Vitals:   06/12/24 1130 06/12/24 1200  BP: 104/62 113/64  Pulse: (!) 57 60  Resp: 17 17  Temp:    SpO2: 97% 96%    Last Pain:  Vitals:   06/12/24 1033  TempSrc:   PainSc: 0-No pain   Pain Goal:                   Marcellis Frampton L Margues Filippini

## 2024-06-12 NOTE — Transfer of Care (Signed)
 Immediate Anesthesia Transfer of Care Note  Patient: Beth Soto  Procedure(s) Performed: ATRIAL FIBRILLATION ABLATION  Patient Location: Cath Lab  Anesthesia Type:General  Level of Consciousness: awake, alert , oriented, and patient cooperative  Airway & Oxygen Therapy: Patient Spontanous Breathing and Patient connected to nasal cannula oxygen  Post-op Assessment: Report given to RN, Post -op Vital signs reviewed and stable, Patient moving all extremities, Patient moving all extremities X 4, and Patient able to stick tongue midline  Post vital signs: Reviewed and stable  Last Vitals:  Vitals Value Taken Time  BP 114/67 06/12/24 09:49  Temp    Pulse 68 06/12/24 09:49  Resp 16 06/12/24 09:49  SpO2 94 % 06/12/24 09:49  Vitals shown include unfiled device data.  Last Pain:  Vitals:   06/12/24 0701  TempSrc: Oral         Complications: There were no known notable events for this encounter.

## 2024-06-13 ENCOUNTER — Telehealth (HOSPITAL_COMMUNITY): Payer: Self-pay

## 2024-06-13 ENCOUNTER — Encounter (HOSPITAL_COMMUNITY): Payer: Self-pay | Admitting: Cardiology

## 2024-06-13 LAB — POCT ACTIVATED CLOTTING TIME: Activated Clotting Time: 291 s

## 2024-06-13 NOTE — Telephone Encounter (Signed)
 Spoke with patient to complete post procedure follow up call.  Patient reports no complications with groin sites.   Instructions reviewed with patient:  Remove large bandage at puncture site after 24 hours. It is normal to have bruising, tenderness, mild swelling, and a pea or marble sized lump/knot at the groin site which can take up to three months to resolve.  Get help right away if you notice sudden swelling at the puncture site.  Check your puncture site every day for signs of infection: fever, redness, swelling, pus drainage, warmth, foul odor or excessive pain. If this occurs, please call [phone], to speak with the RN Navigator. Get help right away if your puncture site is bleeding and the bleeding does not stop after applying firm pressure to the area.  You may continue to have skipped beats/ atrial fibrillation during the first several months after your procedure.  It is very important not to miss any doses of your blood thinner Eliquis.    You will follow up with the Afib clinic on 07/10/24 and follow up with the Afib clinic on 09/11/24.   Patient verbalized understanding to all instructions provided.

## 2024-06-19 ENCOUNTER — Encounter: Payer: Self-pay | Admitting: Cardiology

## 2024-06-19 ENCOUNTER — Ambulatory Visit (HOSPITAL_COMMUNITY)
Admission: RE | Admit: 2024-06-19 | Discharge: 2024-06-19 | Disposition: A | Discharge status: Home / Self Care | Source: Ambulatory Visit | Attending: Physician Assistant | Admitting: Physician Assistant

## 2024-06-19 ENCOUNTER — Telehealth (HOSPITAL_COMMUNITY): Payer: Self-pay

## 2024-06-19 DIAGNOSIS — I4819 Other persistent atrial fibrillation: Secondary | ICD-10-CM

## 2024-06-19 NOTE — Progress Notes (Signed)
 Patient comes in today for a groin check post ablation. She messaged the clinic today with oozing from left groin site (see phone note from earlier today). On exam today, the site looks clean and dry. Some blood on gauze but none from the wound. No erythema or drainage. Small hematoma appreciated, no tenderness.   Bandaging reapplied and ED precautions provided. Will have her continue groin precautions for another week. F/u in the AF clinic as scheduled.

## 2024-06-19 NOTE — Telephone Encounter (Signed)
 Received a call from patient s/p AF ablation on 9/3 with Dr. Inocencio. Reports on yesterday afternoon, last night and this morning she had intermittent episodes of bleeding from her left groin site, that would occasionally soak past a gauze pad dressing. She applied pressure for 15 minutes and bleeding resolved, but then later restarted. Reports blood started off as dark red but this morning as were speaking, it was a light pink appearance. She reports she had a walnut size knot that recently went down and increased pain that started. Advised she it sounds like a small hematoma that has expressed itself, but due to continuation of discharge and increase in pain, will schedule an appointment with AF clinic to further evaluate on today. Made aware to seek immediate treatment, if bleeding increases and does not stop after applying firm pressure to the area. Patient agreed with plan and expressed appreciation.

## 2024-06-24 ENCOUNTER — Ambulatory Visit: Admitting: Cardiology

## 2024-07-10 ENCOUNTER — Encounter (HOSPITAL_COMMUNITY): Payer: Self-pay | Admitting: Internal Medicine

## 2024-07-10 ENCOUNTER — Ambulatory Visit (HOSPITAL_COMMUNITY)
Admit: 2024-07-10 | Discharge: 2024-07-10 | Disposition: A | Discharge status: Home / Self Care | Attending: Internal Medicine | Admitting: Internal Medicine

## 2024-07-10 VITALS — BP 102/60 | HR 64 | Ht 64.0 in | Wt 212.8 lb

## 2024-07-10 DIAGNOSIS — I480 Paroxysmal atrial fibrillation: Secondary | ICD-10-CM

## 2024-07-10 DIAGNOSIS — Z79899 Other long term (current) drug therapy: Secondary | ICD-10-CM | POA: Diagnosis not present

## 2024-07-10 DIAGNOSIS — Z5181 Encounter for therapeutic drug level monitoring: Secondary | ICD-10-CM

## 2024-07-10 DIAGNOSIS — D6869 Other thrombophilia: Secondary | ICD-10-CM | POA: Diagnosis not present

## 2024-07-10 NOTE — Progress Notes (Signed)
 Primary Care Physician: Beth Vicenta BRAVO, MD Primary Cardiologist: Beth Fitch, MD Electrophysiologist: Beth Gladis Norton, MD     Referring Physician: Dr. Norton Mitzie Soto Beth Soto is a 68 y.o. female with a history of Raynaud's disease, HLD, NSVT, dilated cardiomyopathy, and persistent atrial fibrillation who presents for consultation in the [facility]. Patient is on Eliquis 5 mg BID for a CHADS2VASC score of 3.  On evaluation today, patient is currently in NSR. S/p Afib ablation on 06/12/24 by Dr. Norton. No episodes of Afib since ablation. She is taking amiodarone  200 mg daily. No chest pain or SOB. Leg sites healed without issue since groin check on 9/10. No missed doses of anticoagulant.  Today, she denies symptoms of orthopnea, PND, lower extremity edema, dizziness, presyncope, syncope, snoring, daytime somnolence, bleeding, or neurologic sequela. The patient is tolerating medications without difficulties and is otherwise without complaint today.    she has a BMI of Body mass index is 36.53 kg/m.[name] Filed Weights   07/10/24 1444  Weight: 96.5 kg    Current Outpatient Medications  Medication Sig Dispense Refill   amiodarone  (PACERONE ) 200 MG tablet Take 2 tablets (400 mg total) TWICE a day for 2 weeks, then take 1 tablet (200 mg total) TWICE a day for 2 weeks, then take 1 tablet (200 mg total) ONCE a day thereafter 90 tablet 3   atorvastatin (LIPITOR) 20 MG tablet Take 20 mg by mouth at bedtime.     Calcium Carb-Cholecalciferol (CALCIUM 600 + D PO) Take 1 tablet by mouth daily. Unknown strength     clobetasol (TEMOVATE) 0.05 % external solution Apply 1 Application topically See admin instructions. Friday, Saturday and Sunday     diltiazem  (CARDIZEM  CD) 120 MG 24 hr capsule Take 1 capsule (120 mg total) by mouth daily. 90 capsule 3   ELIQUIS 5 MG TABS tablet Take 5 mg by mouth 2 (two) times daily.     furosemide  (LASIX ) 20 MG tablet Take 1  tablet (20 mg total) by mouth daily. 90 tablet 3   levothyroxine (SYNTHROID) 150 MCG tablet Take 150 mcg by mouth daily before breakfast.     losartan  (COZAAR ) 25 MG tablet Take 1 tablet (25 mg total) by mouth daily. 90 tablet 3   metoprolol  tartrate (LOPRESSOR ) 25 MG tablet Take 1 tablet (25 mg total) by mouth 2 (two) times daily. 180 tablet 3   minoxidil (LONITEN) 2.5 MG tablet Take 1.25 mg by mouth daily.     tacrolimus (PROTOPIC) 0.1 % ointment Apply 1 Application topically at bedtime.     Current Facility-Administered Medications  Medication Dose Route Frequency Provider Last Rate Last Admin   0.9 %  sodium chloride  infusion  500 mL Intravenous Continuous Beth Toribio SQUIBB, MD        Atrial Fibrillation Management history:  Previous antiarrhythmic drugs: amiodarone  Previous cardioversions: 02/15/24 Previous ablations: 06/12/24 Anticoagulation history: Eliquis   ROS- All systems are reviewed and negative except as per the HPI above.  Physical Exam: BP 102/60   Pulse 64   Ht 5' 4 (1.626 m)   Wt 96.5 kg   BMI 36.53 kg/m   GEN: Well nourished, well developed in no acute distress NECK: No JVD; No carotid bruits CARDIAC: Regular rate and rhythm, no murmurs, rubs, gallops RESPIRATORY:  Clear to auscultation without rales, wheezing or rhonchi  ABDOMEN: Soft, non-tender, non-distended EXTREMITIES:  No edema; No deformity   EKG today demonstrates  Vent. rate  64 BPM PR interval 210 ms QRS duration 104 ms QT/QTcB 464/478 ms P-R-T axes 43 -10 33 Sinus rhythm with 1st degree A-V block Otherwise normal ECG When compared with ECG of 30-Apr-2024 12:49, No significant change was found  Echo 02/15/24 (TEE) demonstrated  1. Left ventricular ejection fraction, by estimation, is 45 to 50%. The  left ventricle has mildly decreased function. The left ventricle  demonstrates global hypokinesis.   2. Right ventricular systolic function is normal. The right ventricular  size is normal.   3.  No left atrial/left atrial appendage thrombus was detected. The LAA  emptying velocity was 92 cm/s.   4. There is a small ( 0.41 cm x 0.31 cm) mass on the interatrial septum  on the left atrial side.   5. The mitral valve is normal in structure. Mild mitral valve  regurgitation. No evidence of mitral stenosis.   6. Tricuspid valve regurgitation is mild to moderate.   7. The aortic valve is normal in structure. Aortic valve regurgitation is  not visualized. No aortic stenosis is present.   8. The inferior vena cava is normal in size with greater than 50%  respiratory variability, suggesting right atrial pressure of 3 mmHg.   ASSESSMENT & PLAN CHA2DS2-VASc Score = 3  The patient's score is based upon: CHF History: 1 HTN History: 0 Diabetes History: 0 Stroke History: 0 Vascular Disease History: 0 Age Score: 1 Gender Score: 1       ASSESSMENT AND PLAN: Persistent Atrial Fibrillation (ICD10:  I48.19) The patient's CHA2DS2-VASc score is 3, indicating a 3.2% annual risk of stroke.   S/p Afib ablation on 06/12/24 by Dr. Inocencio.  Patient is currently in NSR. Continue Lopressor  25 mg BID. Anticipate discontinuation of amiodarone  at upcoming visit.  Secondary Hypercoagulable State (ICD10:  D68.69) The patient is at significant risk for stroke/thromboembolism based upon her CHA2DS2-VASc Score of 3.  Continue Apixaban (Eliquis).  Continue Eliquis.    High risk medication monitoring (ICD10: [id number]) Patient requires ongoing monitoring for anti-arrhythmic medication which has the potential to cause life threatening arrhythmias or AV block. Qtc stable. Continue amiodarone  200 mg daily.    Follow up with Afib clinic as scheduled.    Beth Soto, [facility]  Afib Clinic [address] [place], [place] [address] [phone]

## 2024-07-18 ENCOUNTER — Other Ambulatory Visit: Payer: Self-pay | Admitting: Cardiology

## 2024-07-19 NOTE — Telephone Encounter (Signed)
 This is a A-Fib clinic pt that sees pt for Dr. Inocencio. Please address

## 2024-07-31 DIAGNOSIS — I480 Paroxysmal atrial fibrillation: Secondary | ICD-10-CM | POA: Diagnosis not present

## 2024-07-31 DIAGNOSIS — E785 Hyperlipidemia, unspecified: Secondary | ICD-10-CM | POA: Diagnosis not present

## 2024-07-31 DIAGNOSIS — E039 Hypothyroidism, unspecified: Secondary | ICD-10-CM | POA: Diagnosis not present

## 2024-07-31 DIAGNOSIS — I4729 Other ventricular tachycardia: Secondary | ICD-10-CM | POA: Diagnosis not present

## 2024-07-31 DIAGNOSIS — R7301 Impaired fasting glucose: Secondary | ICD-10-CM | POA: Diagnosis not present

## 2024-08-01 ENCOUNTER — Encounter: Payer: Self-pay | Admitting: Cardiology

## 2024-08-01 ENCOUNTER — Encounter: Payer: Self-pay | Admitting: *Deleted

## 2024-08-01 ENCOUNTER — Ambulatory Visit: Attending: Cardiology | Admitting: Cardiology

## 2024-08-01 VITALS — BP 140/80 | HR 58 | Ht 64.0 in | Wt 206.0 lb

## 2024-08-01 DIAGNOSIS — Z9889 Other specified postprocedural states: Secondary | ICD-10-CM

## 2024-08-01 DIAGNOSIS — I480 Paroxysmal atrial fibrillation: Secondary | ICD-10-CM | POA: Diagnosis not present

## 2024-08-01 DIAGNOSIS — Z8679 Personal history of other diseases of the circulatory system: Secondary | ICD-10-CM | POA: Diagnosis not present

## 2024-08-01 DIAGNOSIS — I420 Dilated cardiomyopathy: Secondary | ICD-10-CM

## 2024-08-01 NOTE — Patient Instructions (Signed)
 Medication Instructions:  Your physician recommends that you continue on your current medications as directed. Please refer to the Current Medication list given to you today.  *If you need a refill on your cardiac medications before your next appointment, please call your pharmacy*   Lab Work: None ordered If you have labs (blood work) drawn today and your tests are completely normal, you will receive your results only by: MyChart Message (if you have MyChart) OR A paper copy in the mail If you have any lab test that is abnormal or we need to change your treatment, we will call you to review the results.  Testing/Procedures: Your physician has requested that you have an echocardiogram. Echocardiography is a painless test that uses sound waves to create images of your heart. It provides your doctor with information about the size and shape of your heart and how well your heart's chambers and valves are working. This procedure takes approximately one hour. There are no restrictions for this procedure. Please do NOT wear cologne, perfume, aftershave, or lotions (deodorant is allowed). Please arrive 15 minutes prior to your appointment time.  Please note: We ask at that you not bring children with you during ultrasound (echo/ vascular) testing. Due to room size and safety concerns, children are not allowed in the ultrasound rooms during exams. Our front office staff cannot provide observation of children in our lobby area while testing is being conducted. An adult accompanying a patient to their appointment will only be allowed in the ultrasound room at the discretion of the ultrasound technician under special circumstances. We apologize for any inconvenience.  Follow-Up: At [facility], you and your health needs are our priority.  As part of our continuing mission to provide you with exceptional heart care, we have created designated Provider Care Teams.  These Care Teams include your primary  Cardiologist (physician) and Advanced Practice Providers (APPs -  Physician Assistants and Nurse Practitioners) who all work together to provide you with the care you need, when you need it.  We recommend signing up for the patient portal called MyChart.  Sign up information is provided on this After Visit Summary.  MyChart is used to connect with patients for Virtual Visits (Telemedicine).  Patients are able to view lab/test results, encounter notes, upcoming appointments, etc.  Non-urgent messages can be sent to your provider as well.   To learn more about what you can do with MyChart, go to [email or web address].    Your next appointment:   6 month(s)  The format for your next appointment:   In Person  Provider:   Lamar Fitch, MD   Other Instructions Echocardiogram An echocardiogram is a test that uses sound waves (ultrasound) to produce images of the heart. Images from an echocardiogram can provide important information about: Heart size and shape. The size and thickness and movement of your heart's walls. Heart muscle function and strength. Heart valve function or if you have stenosis. Stenosis is when the heart valves are too narrow. If blood is flowing backward through the heart valves (regurgitation). A tumor or infectious growth around the heart valves. Areas of heart muscle that are not working well because of poor blood flow or injury from a heart attack. Aneurysm detection. An aneurysm is a weak or damaged part of an artery wall. The wall bulges out from the normal force of blood pumping through the body. Tell a health care provider about: Any allergies you have. All medicines you are taking, including vitamins, herbs,  eye drops, creams, and over-the-counter medicines. Any blood disorders you have. Any surgeries you have had. Any medical conditions you have. Whether you are pregnant or may be pregnant. What are the risks? Generally, this is a safe test.  However, problems may occur, including an allergic reaction to dye (contrast) that may be used during the test. What happens before the test? No specific preparation is needed. You may eat and drink normally. What happens during the test? You will take off your clothes from the waist up and put on a hospital gown. Electrodes or electrocardiogram (ECG)patches may be placed on your chest. The electrodes or patches are then connected to a device that monitors your heart rate and rhythm. You will lie down on a table for an ultrasound exam. A gel will be applied to your chest to help sound waves pass through your skin. A handheld device, called a transducer, will be pressed against your chest and moved over your heart. The transducer produces sound waves that travel to your heart and bounce back (or echo back) to the transducer. These sound waves will be captured in real-time and changed into images of your heart that can be viewed on a video monitor. The images will be recorded on a computer and reviewed by your health care provider. You may be asked to change positions or hold your breath for a short time. This makes it easier to get different views or better views of your heart. In some cases, you may receive contrast through an IV in one of your veins. This can improve the quality of the pictures from your heart. The procedure may vary among health care providers and hospitals.   What can I expect after the test? You may return to your normal, everyday life, including diet, activities, and medicines, unless your health care provider tells you not to do that. Follow these instructions at home: It is up to you to get the results of your test. Ask your health care provider, or the department that is doing the test, when your results will be ready. Keep all follow-up visits. This is important. Summary An echocardiogram is a test that uses sound waves (ultrasound) to produce images of the heart. Images  from an echocardiogram can provide important information about the size and shape of your heart, heart muscle function, heart valve function, and other possible heart problems. You do not need to do anything to prepare before this test. You may eat and drink normally. After the echocardiogram is completed, you may return to your normal, everyday life, unless your health care provider tells you not to do that. This information is not intended to replace advice given to you by your health care provider. Make sure you discuss any questions you have with your health care provider. Document Revised: 05/19/2020 Document Reviewed: 05/19/2020 Elsevier Patient Education  2021 Elsevier Inc.   Important Information About Sugar

## 2024-08-01 NOTE — Progress Notes (Signed)
 Cardiology Office Note:    Date:  08/01/2024   ID:  Beth Soto, DOB Mar 21, 1956, MRN [id number]  PCP:  Keren Vicenta FORBES, MD  Cardiologist:  Lamar Fitch, MD    Referring MD: Keren Vicenta FORBES, MD   Chief Complaint  Patient presents with   Follow-up  Doing great  History of Present Illness:    Beth Soto is a 68 y.o. female past medical history significant for paroxysmal atrial fibrillation, in September she got atrial fibrillation ablation done since that time doing great, additional problem include diminished ejection fraction 40 to 45%, hyperlipidemia.  Comes today for follow-up overall doing well no chest pain tightness squeezing pressure mid chest no palpitations.  Recently she went to [place]  she said she was short of breath walking around.  No swelling of lower extremities no dizziness no passing out  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; on INS for 1 year   Ventricular ectopy 08/20/2021    Past Surgical History:  Procedure Laterality Date   ATRIAL FIBRILLATION ABLATION N/A 06/12/2024   Procedure: ATRIAL FIBRILLATION ABLATION;  Surgeon: Inocencio Soyla Lunger, MD;  Location: [facility];  Service: Cardiovascular;  Laterality: N/A;   CARDIOVERSION N/A 02/15/2024   Procedure: CARDIOVERSION;  Surgeon: Sheena Pugh, DO;  Location: [facility];  Service: Cardiovascular;  Laterality: N/A;   COLONOSCOPY     HAMMER  TOE SURGERY     JOINT REPLACEMENT     MELANOMA EXCISION     TOTAL HIP ARTHROPLASTY Right 04/2020   TRANSESOPHAGEAL ECHOCARDIOGRAM (CATH LAB) N/A 02/15/2024   Procedure: TRANSESOPHAGEAL ECHOCARDIOGRAM;  Surgeon: Sheena Pugh, DO;  Location: [facility] CV [facility];  Service: Cardiovascular;  Laterality: N/A;    Current Medications: Current Meds  Medication Sig   amiodarone  (PACERONE ) 200 MG tablet Take 1 tablet (200 mg total) by mouth daily.   atorvastatin (LIPITOR) 20 MG tablet Take 20 mg by mouth at bedtime.   Calcium Carb-Cholecalciferol (CALCIUM 600 + D PO) Take 1 tablet by mouth daily. Unknown strength   clobetasol (TEMOVATE) 0.05 % external solution Apply 1 Application topically See admin instructions. Friday, Saturday and Sunday   diltiazem  (CARDIZEM  CD) 120 MG 24 hr capsule Take 1 capsule (120 mg total) by mouth daily.   ELIQUIS 5 MG TABS tablet Take 5 mg by mouth 2 (two) times daily.   furosemide  (LASIX ) 20 MG tablet Take 1 tablet (20 mg total) by mouth daily.   levothyroxine (SYNTHROID) 175 MCG tablet Take 175 mcg by mouth daily before breakfast.   losartan  (COZAAR ) 25 MG tablet Take 1 tablet (25 mg total) by mouth daily.   metoprolol  tartrate (LOPRESSOR ) 25 MG tablet Take 1 tablet (25 mg total) by mouth 2 (two) times daily.   minoxidil (LONITEN) 2.5 MG tablet Take 1.25 mg by mouth daily.   tacrolimus (PROTOPIC) 0.1 % ointment Apply 1 Application topically at  bedtime.   Current Facility-Administered Medications for the 08/01/24 encounter (Office Visit) with Damyah Gugel J, MD  Medication   0.9 %  sodium chloride  infusion     Allergies:   Patient has no known allergies.   Social History   Socioeconomic History   Marital status: Widowed    Spouse name: Not on file   Number of children: 1   Years of education: Not on file   Highest education level: Not on file  Occupational History   Occupation: retired Forensic scientist  Tobacco Use   Smoking status: Never    Smokeless tobacco: Never   Tobacco comments:    Never smoked 07/10/24  Vaping Use   Vaping status: Never Used  Substance and Sexual Activity   Alcohol use: Yes    Comment: occassionally   Drug use: No   Sexual activity: Not on file  Other Topics Concern   Not on file  Social History Narrative   Not on file   Social Drivers of Health   Financial Resource Strain: Not on file  Food Insecurity: Low Risk  (05/08/2024)   Received from [facility]   Hunger Vital Sign    Within the past 12 months, you worried that your food would run out before you got money to buy more: Never true    Within the past 12 months, the food you bought just didn't last and you didn't have money to get more. : Never true  Transportation Needs: No Transportation Needs (05/08/2024)   Received from [facility]    In the past 12 months, has lack of reliable transportation kept you from medical appointments, meetings, work or from getting things needed for daily living? : No  Physical Activity: Not on file  Stress: Not on file  Social Connections: Not on file     Family History: The patient's family history includes Atrial fibrillation in her father; Bladder Cancer in her brother; COPD in her brother; Colon polyps in her mother; Diabetes in her brother; Heart disease in her father and mother; Heart failure in her brother, father, and mother; Hepatitis C in her brother; Hypertension in her mother; Stroke in her father and mother. There is no history of Colon cancer, Esophageal cancer, Rectal cancer, or Stomach cancer. ROS:   Please see the history of present illness.    All 14 point review of systems negative except as described per history of present illness  EKGs/Labs/Other Studies Reviewed:         Recent Labs: 05/22/2024: BUN 23; Creatinine, Ser 0.97; Hemoglobin 13.8; Platelets 379; Potassium 4.4; Sodium 138  Recent Lipid Panel No results found for: CHOL, TRIG, HDL, CHOLHDL,  VLDL, LDLCALC, LDLDIRECT  Physical Exam:    VS:  BP (!) 140/80   Pulse (!) 58   Ht 5' 4 (1.626 m)   Wt 206 lb (93.4 kg)   SpO2 99%   BMI 35.36 kg/m     Wt Readings from Last 3 Encounters:  08/01/24 206 lb (93.4 kg)  07/10/24 212 lb 12.8 oz (96.5 kg)  06/12/24 200 lb (90.7 kg)     GEN:  Well nourished, well developed in no acute distress HEENT: Normal NECK: No JVD; No carotid bruits LYMPHATICS: No lymphadenopathy CARDIAC: RRR, no murmurs, no rubs, no gallops RESPIRATORY:  Clear to auscultation without rales, wheezing or rhonchi  ABDOMEN: Soft, non-tender, non-distended MUSCULOSKELETAL:  No edema; No deformity  SKIN: Warm and dry LOWER EXTREMITIES: no swelling NEUROLOGIC:  Alert  and oriented x 3 PSYCHIATRIC:  Normal affect   ASSESSMENT:    1. Paroxysmal atrial fibrillation (HCC)   2. Dilated cardiomyopathy (HCC)   3. Status post ablation of atrial fibrillation    PLAN:    In order of problems listed above:  Status post atrial fibrillation ablation, doing well maintaining sinus rhythm continue anticoagulation, she is scheduled to see our EP team to decide about potential discontinuation of amiodarone  which I suspect will happen. History of cardiomyopathy will repeat echocardiogram to see if there is any improvement if from there is normal then we will initiate appropriate diagnostic medical therapy.  So far she is only on because her and metoprolol . Dyslipidemia I did review KPN which show me LDL 66 HDL 68 continue present management   Medication Adjustments/Labs and Tests Ordered: Current medicines are reviewed at length with the patient today.  Concerns regarding medicines are outlined above.  Orders Placed This Encounter  Procedures   EKG 12-Lead   Medication changes: No orders of the defined types were placed in this encounter.   Signed, Lamar DOROTHA Fitch, MD, [facility] 08/01/2024 8:25 AM    [facility]

## 2024-08-01 NOTE — Addendum Note (Signed)
 Addended by: ONEITA BERLINER on: 08/01/2024 08:39 AM   Modules accepted: Orders

## 2024-08-08 DIAGNOSIS — Z872 Personal history of diseases of the skin and subcutaneous tissue: Secondary | ICD-10-CM | POA: Diagnosis not present

## 2024-08-14 DIAGNOSIS — D225 Melanocytic nevi of trunk: Secondary | ICD-10-CM | POA: Diagnosis not present

## 2024-08-14 DIAGNOSIS — L821 Other seborrheic keratosis: Secondary | ICD-10-CM | POA: Diagnosis not present

## 2024-08-14 DIAGNOSIS — D2239 Melanocytic nevi of other parts of face: Secondary | ICD-10-CM | POA: Diagnosis not present

## 2024-08-14 DIAGNOSIS — L814 Other melanin hyperpigmentation: Secondary | ICD-10-CM | POA: Diagnosis not present

## 2024-08-14 DIAGNOSIS — D485 Neoplasm of uncertain behavior of skin: Secondary | ICD-10-CM | POA: Diagnosis not present

## 2024-08-21 DIAGNOSIS — M79671 Pain in right foot: Secondary | ICD-10-CM | POA: Diagnosis not present

## 2024-08-22 ENCOUNTER — Ambulatory Visit: Attending: Cardiology

## 2024-08-22 DIAGNOSIS — I480 Paroxysmal atrial fibrillation: Secondary | ICD-10-CM | POA: Diagnosis not present

## 2024-08-22 DIAGNOSIS — I420 Dilated cardiomyopathy: Secondary | ICD-10-CM | POA: Diagnosis not present

## 2024-08-22 LAB — ECHOCARDIOGRAM COMPLETE
AR max vel: 1.79 cm2
AV Area VTI: 1.74 cm2
AV Area mean vel: 1.75 cm2
AV Mean grad: 3.9 mmHg
AV Peak grad: 7.3 mmHg
Ao pk vel: 1.35 m/s
Area-P 1/2: 3.42 cm2
MV M vel: 4.52 m/s
MV Peak grad: 81.7 mmHg
Radius: 0.4 cm
S' Lateral: 3.5 cm

## 2024-08-29 ENCOUNTER — Ambulatory Visit: Payer: Self-pay | Admitting: Cardiology

## 2024-08-29 DIAGNOSIS — E039 Hypothyroidism, unspecified: Secondary | ICD-10-CM | POA: Diagnosis not present

## 2024-09-09 DIAGNOSIS — H524 Presbyopia: Secondary | ICD-10-CM | POA: Diagnosis not present

## 2024-09-11 ENCOUNTER — Encounter (HOSPITAL_COMMUNITY): Payer: Self-pay | Admitting: Internal Medicine

## 2024-09-11 ENCOUNTER — Ambulatory Visit (HOSPITAL_COMMUNITY): Admit: 2024-09-11 | Discharge: 2024-09-11 | Disposition: A | Attending: Internal Medicine | Admitting: Internal Medicine

## 2024-09-11 VITALS — BP 126/70 | HR 65 | Ht 64.0 in | Wt 196.4 lb

## 2024-09-11 DIAGNOSIS — D6869 Other thrombophilia: Secondary | ICD-10-CM | POA: Diagnosis not present

## 2024-09-11 DIAGNOSIS — I4819 Other persistent atrial fibrillation: Secondary | ICD-10-CM | POA: Diagnosis not present

## 2024-09-11 DIAGNOSIS — I480 Paroxysmal atrial fibrillation: Secondary | ICD-10-CM

## 2024-09-11 DIAGNOSIS — Z5181 Encounter for therapeutic drug level monitoring: Secondary | ICD-10-CM

## 2024-09-11 NOTE — Patient Instructions (Signed)
 Stop Amiodarone

## 2024-09-11 NOTE — Progress Notes (Signed)
 Primary Care Physician: Beth Vicenta BRAVO, MD Primary Cardiologist: Lamar Fitch, MD Electrophysiologist: Will Gladis Norton, MD     Referring Physician: Dr. Norton Beth Soto Beth Soto is a 68 y.o. female with a history of Raynaud's disease, HLD, NSVT, dilated cardiomyopathy, and persistent atrial fibrillation who presents for consultation in the [facility]. Patient is on Eliquis 5 mg BID for a CHADS2VASC score of 3. S/p Afib ablation on 06/12/24 by Dr. Norton.   On follow-up 09/11/2024, patient is currently in NSR.  She has had overall no A-fib burden since last office visit confirmed with [employer] device. She is taking amiodarone  200 mg daily.  No missed doses of Eliquis.  Today, she denies symptoms of orthopnea, PND, lower extremity edema, dizziness, presyncope, syncope, snoring, daytime somnolence, bleeding, or neurologic sequela. The patient is tolerating medications without difficulties and is otherwise without complaint today.    she has a BMI of Body mass index is 33.71 kg/m.[name] Filed Weights   09/11/24 1503  Weight: 89.1 kg     Current Outpatient Medications  Medication Sig Dispense Refill   atorvastatin (LIPITOR) 20 MG tablet Take 20 mg by mouth at bedtime.     Calcium Carb-Cholecalciferol (CALCIUM 600 + D PO) Take 1 tablet by mouth daily. Unknown strength     clobetasol (TEMOVATE) 0.05 % external solution Apply 1 Application topically See admin instructions. Friday, Saturday and Sunday     diltiazem  (CARDIZEM  CD) 120 MG 24 hr capsule Take 1 capsule (120 mg total) by mouth daily. 90 capsule 3   ELIQUIS 5 MG TABS tablet Take 5 mg by mouth 2 (two) times daily.     furosemide  (LASIX ) 20 MG tablet Take 1 tablet (20 mg total) by mouth daily. 90 tablet 3   levothyroxine (SYNTHROID) 175 MCG tablet Take 175 mcg by mouth daily before breakfast.     losartan  (COZAAR ) 25 MG tablet Take 1 tablet (25 mg total) by mouth daily. 90 tablet 3    metoprolol  tartrate (LOPRESSOR ) 25 MG tablet Take 1 tablet (25 mg total) by mouth 2 (two) times daily. 180 tablet 3   minoxidil (LONITEN) 2.5 MG tablet Take 1.25 mg by mouth daily.     tacrolimus (PROTOPIC) 0.1 % ointment Apply 1 Application topically at bedtime.     Current Facility-Administered Medications  Medication Dose Route Frequency Provider Last Rate Last Admin   0.9 %  sodium chloride  infusion  500 mL Intravenous Continuous Teressa Toribio SQUIBB, MD        Atrial Fibrillation Management history:  Previous antiarrhythmic drugs: amiodarone  Previous cardioversions: 02/15/24 Previous ablations: 06/12/24 Anticoagulation history: Eliquis   ROS- All systems are reviewed and negative except as per the HPI above.  Physical Exam: BP 126/70   Pulse 65   Ht 5' 4 (1.626 m)   Wt 89.1 kg   BMI 33.71 kg/m   GEN- The patient is well appearing, alert and oriented x 3 today.   Neck - no JVD or carotid bruit noted Lungs- Clear to ausculation bilaterally, normal work of breathing Heart- Regular rate and rhythm, no murmurs, rubs or gallops, PMI not laterally displaced Extremities- no clubbing, cyanosis, or edema Skin - no rash or ecchymosis noted   EKG today demonstrates  EKG Interpretation Date/Time:  Wednesday September 11 2024 15:05:24 EST Ventricular Rate:  65 PR Interval:  204 QRS Duration:  112 QT Interval:  448 QTC Calculation: 465 R Axis:   -7  Text Interpretation:  Normal sinus rhythm with sinus arrhythmia When compared with ECG of 10-Jul-2024 14:47, No significant change was found Confirmed by Terra Pac [phone]) on 09/11/2024 3:07:53 PM    Echo 02/15/24 (TEE) demonstrated  1. Left ventricular ejection fraction, by estimation, is 45 to 50%. The  left ventricle has mildly decreased function. The left ventricle  demonstrates global hypokinesis.   2. Right ventricular systolic function is normal. The right ventricular  size is normal.   3. No left atrial/left atrial appendage  thrombus was detected. The LAA  emptying velocity was 92 cm/s.   4. There is a small ( 0.41 cm x 0.31 cm) mass on the interatrial septum  on the left atrial side.   5. The mitral valve is normal in structure. Mild mitral valve  regurgitation. No evidence of mitral stenosis.   6. Tricuspid valve regurgitation is mild to moderate.   7. The aortic valve is normal in structure. Aortic valve regurgitation is  not visualized. No aortic stenosis is present.   8. The inferior vena cava is normal in size with greater than 50%  respiratory variability, suggesting right atrial pressure of 3 mmHg.   ASSESSMENT & PLAN CHA2DS2-VASc Score = 3  The patient's score is based upon: CHF History: 1 HTN History: 0 Diabetes History: 0 Stroke History: 0 Vascular Disease History: 0 Age Score: 1 Gender Score: 1       ASSESSMENT AND PLAN: Persistent Atrial Fibrillation (ICD10:  I48.19) The patient's CHA2DS2-VASc score is 3, indicating a 3.2% annual risk of stroke.   S/p Afib ablation on 06/12/24 by Dr. Inocencio.  Patient is currently in NSR.  After discussion, we will stop amiodarone  today.  Continue Lopressor  25 mg twice daily.  Continue diltiazem  120 mg daily.  Secondary Hypercoagulable State (ICD10:  D68.69) The patient is at significant risk for stroke/thromboembolism based upon her CHA2DS2-VASc Score of 3.  Continue Apixaban (Eliquis).  No missed doses of Eliquis.  Continue Eliquis 5 mg twice daily.    Follow up with Dr. Inocencio in 6 months.   Terra Pac, [facility]  Afib Clinic [address] [place], [place] [address] [phone]

## 2024-09-18 DIAGNOSIS — M79671 Pain in right foot: Secondary | ICD-10-CM | POA: Diagnosis not present

## 2024-09-26 DIAGNOSIS — S39011A Strain of muscle, fascia and tendon of abdomen, initial encounter: Secondary | ICD-10-CM | POA: Diagnosis not present

## 2024-09-26 DIAGNOSIS — M79671 Pain in right foot: Secondary | ICD-10-CM | POA: Diagnosis not present

## 2024-09-30 ENCOUNTER — Encounter: Payer: Self-pay | Admitting: Cardiology

## 2024-10-22 ENCOUNTER — Encounter: Payer: Self-pay | Admitting: Gastroenterology

## 2024-12-11 ENCOUNTER — Ambulatory Visit: Admitting: Gastroenterology

## 2025-01-09 ENCOUNTER — Ambulatory Visit: Admitting: Cardiology

## 2025-02-24 ENCOUNTER — Ambulatory Visit: Admitting: Cardiology
